# Patient Record
Sex: Female | Born: 1980 | Race: Black or African American | Hispanic: No | Marital: Single | Smoking: Current some day smoker
Health system: Southern US, Community
[De-identification: ages and names within clinical notes are randomized; demographics above are authoritative.]

## PROBLEM LIST (undated history)

## (undated) DIAGNOSIS — I10 Essential (primary) hypertension: Secondary | ICD-10-CM

## (undated) HISTORY — PX: DILATION AND CURETTAGE OF UTERUS: SHX78

---

## 2004-08-16 ENCOUNTER — Emergency Department: Payer: Self-pay | Admitting: Emergency Medicine

## 2005-03-29 ENCOUNTER — Other Ambulatory Visit: Payer: Self-pay

## 2005-03-29 ENCOUNTER — Emergency Department: Payer: Self-pay | Admitting: Emergency Medicine

## 2005-03-30 ENCOUNTER — Ambulatory Visit: Payer: Self-pay | Admitting: Emergency Medicine

## 2006-08-21 ENCOUNTER — Emergency Department: Payer: Self-pay | Admitting: Unknown Physician Specialty

## 2007-03-23 ENCOUNTER — Emergency Department: Payer: Self-pay | Admitting: Emergency Medicine

## 2007-06-25 ENCOUNTER — Emergency Department: Payer: Self-pay | Admitting: Emergency Medicine

## 2007-06-27 ENCOUNTER — Emergency Department: Payer: Self-pay | Admitting: Emergency Medicine

## 2007-06-29 ENCOUNTER — Emergency Department: Payer: Self-pay | Admitting: Emergency Medicine

## 2007-10-16 ENCOUNTER — Emergency Department: Payer: Self-pay | Admitting: Emergency Medicine

## 2008-03-24 ENCOUNTER — Emergency Department: Payer: Self-pay | Admitting: Emergency Medicine

## 2008-07-03 ENCOUNTER — Emergency Department: Payer: Self-pay | Admitting: Emergency Medicine

## 2009-02-13 ENCOUNTER — Emergency Department: Payer: Self-pay | Admitting: Emergency Medicine

## 2009-02-16 ENCOUNTER — Emergency Department: Payer: Self-pay | Admitting: Emergency Medicine

## 2009-04-23 ENCOUNTER — Emergency Department: Payer: Self-pay | Admitting: Internal Medicine

## 2009-08-29 ENCOUNTER — Emergency Department: Payer: Self-pay | Admitting: Emergency Medicine

## 2010-05-01 ENCOUNTER — Emergency Department: Payer: Self-pay | Admitting: Emergency Medicine

## 2010-11-29 ENCOUNTER — Ambulatory Visit: Payer: Self-pay | Admitting: Family Medicine

## 2011-01-06 ENCOUNTER — Emergency Department: Payer: Self-pay | Admitting: Emergency Medicine

## 2011-02-12 ENCOUNTER — Emergency Department: Payer: Self-pay | Admitting: Internal Medicine

## 2012-06-14 ENCOUNTER — Emergency Department: Payer: Self-pay | Admitting: Unknown Physician Specialty

## 2012-06-14 LAB — BASIC METABOLIC PANEL
Chloride: 107 mmol/L (ref 98–107)
EGFR (Non-African Amer.): >60
Glucose: 94 mg/dL (ref 65–99)
Osmolality: 280 (ref 275–301)
Sodium: 140 mmol/L (ref 136–145)

## 2012-06-14 LAB — CBC
HCT: 36.3 % (ref 35.0–47.0)
RBC: 5.04 10*6/uL (ref 3.80–5.20)

## 2012-06-14 LAB — TROPONIN I: Troponin-I: <0.02 ng/mL

## 2012-08-03 ENCOUNTER — Emergency Department: Payer: Self-pay | Admitting: Emergency Medicine

## 2012-08-22 ENCOUNTER — Emergency Department: Payer: Self-pay | Admitting: Emergency Medicine

## 2013-01-21 ENCOUNTER — Emergency Department (HOSPITAL_COMMUNITY): Payer: Medicaid Other

## 2013-01-21 ENCOUNTER — Encounter (HOSPITAL_COMMUNITY): Payer: Self-pay | Admitting: Emergency Medicine

## 2013-01-21 ENCOUNTER — Emergency Department (HOSPITAL_COMMUNITY)
Admission: EM | Admit: 2013-01-21 | Discharge: 2013-01-21 | Disposition: A | Discharge status: Home / Self Care | Payer: Medicaid Other | Attending: Emergency Medicine | Admitting: Emergency Medicine

## 2013-01-21 DIAGNOSIS — Y92410 Unspecified street and highway as the place of occurrence of the external cause: Secondary | ICD-10-CM | POA: Insufficient documentation

## 2013-01-21 DIAGNOSIS — Z79899 Other long term (current) drug therapy: Secondary | ICD-10-CM | POA: Insufficient documentation

## 2013-01-21 DIAGNOSIS — Z3202 Encounter for pregnancy test, result negative: Secondary | ICD-10-CM | POA: Insufficient documentation

## 2013-01-21 DIAGNOSIS — I10 Essential (primary) hypertension: Secondary | ICD-10-CM

## 2013-01-21 DIAGNOSIS — Y9389 Activity, other specified: Secondary | ICD-10-CM | POA: Insufficient documentation

## 2013-01-21 DIAGNOSIS — T07XXXA Unspecified multiple injuries, initial encounter: Secondary | ICD-10-CM

## 2013-01-21 DIAGNOSIS — Z791 Long term (current) use of non-steroidal anti-inflammatories (NSAID): Secondary | ICD-10-CM | POA: Insufficient documentation

## 2013-01-21 DIAGNOSIS — F17200 Nicotine dependence, unspecified, uncomplicated: Secondary | ICD-10-CM | POA: Insufficient documentation

## 2013-01-21 DIAGNOSIS — S301XXA Contusion of abdominal wall, initial encounter: Secondary | ICD-10-CM | POA: Insufficient documentation

## 2013-01-21 DIAGNOSIS — S40019A Contusion of unspecified shoulder, initial encounter: Secondary | ICD-10-CM | POA: Insufficient documentation

## 2013-01-21 HISTORY — DX: Essential (primary) hypertension: I10

## 2013-01-21 LAB — POCT I-STAT, CHEM 8
BUN: 13 mg/dL (ref 6–23)
Calcium, Ion: 1.2 mmol/L (ref 1.12–1.23)
Chloride: 103 mEq/L (ref 96–112)
Creatinine, Ser: 0.9 mg/dL (ref 0.50–1.10)
Glucose, Bld: 88 mg/dL (ref 70–99)
HCT: 40 % (ref 36.0–46.0)
Hemoglobin: 13.6 g/dL (ref 12.0–15.0)
Potassium: 3.5 mEq/L (ref 3.5–5.1)

## 2013-01-21 LAB — URINALYSIS, ROUTINE W REFLEX MICROSCOPIC
Bilirubin Urine: NEGATIVE
Hgb urine dipstick: NEGATIVE
Specific Gravity, Urine: 1.022 (ref 1.005–1.030)
Urobilinogen, UA: 0.2 mg/dL (ref 0.0–1.0)

## 2013-01-21 MED ORDER — IBUPROFEN 800 MG PO TABS: 800.0000 mg | ORAL_TABLET | Freq: Three times a day (TID) | ORAL | Status: DC

## 2013-01-21 MED ORDER — IOHEXOL 300 MG/ML  SOLN
100.0000 mL | Freq: Once | INTRAMUSCULAR | Status: AC | PRN
  Administered 2013-01-21: 100 mL via INTRAVENOUS
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
## 2013-01-26 ENCOUNTER — Emergency Department: Payer: Self-pay | Admitting: Emergency Medicine

## 2013-04-15 ENCOUNTER — Ambulatory Visit: Payer: Medicaid Other

## 2013-06-01 ENCOUNTER — Emergency Department: Payer: Self-pay | Admitting: Emergency Medicine

## 2013-06-01 LAB — URINALYSIS, COMPLETE
BACTERIA: NONE SEEN
Bilirubin,UR: NEGATIVE
Blood: NEGATIVE
Glucose,UR: NEGATIVE mg/dL (ref 0–75)
Ketone: NEGATIVE
Leukocyte Esterase: NEGATIVE
NITRITE: NEGATIVE
PROTEIN: NEGATIVE
Ph: 6 (ref 4.5–8.0)
Specific Gravity: 1.024 (ref 1.003–1.030)
Squamous Epithelial: 2
WBC UR: 2 /HPF (ref 0–5)

## 2013-06-01 LAB — CBC WITH DIFFERENTIAL/PLATELET
BASOS ABS: 0 10*3/uL (ref 0.0–0.1)
Basophil %: 0.4 %
EOS ABS: 0.2 10*3/uL (ref 0.0–0.7)
EOS PCT: 1.7 %
HCT: 37.7 % (ref 35.0–47.0)
HGB: 11.8 g/dL — ABNORMAL LOW (ref 12.0–16.0)
Lymphocyte #: 2.7 10*3/uL (ref 1.0–3.6)
Lymphocyte %: 21.5 %
MCH: 22 pg — AB (ref 26.0–34.0)
MCHC: 31.2 g/dL — ABNORMAL LOW (ref 32.0–36.0)
MCV: 71 fL — ABNORMAL LOW (ref 80–100)
MONO ABS: 0.7 x10 3/mm (ref 0.2–0.9)
Monocyte %: 5.7 %
Neutrophil #: 9 10*3/uL — ABNORMAL HIGH (ref 1.4–6.5)
Neutrophil %: 70.7 %
Platelet: 284 10*3/uL (ref 150–440)
RBC: 5.35 10*6/uL — ABNORMAL HIGH (ref 3.80–5.20)
RDW: 19.4 % — AB (ref 11.5–14.5)
WBC: 12.7 10*3/uL — ABNORMAL HIGH (ref 3.6–11.0)

## 2013-06-01 LAB — BASIC METABOLIC PANEL
Anion Gap: 4 — ABNORMAL LOW (ref 7–16)
BUN: 8 mg/dL (ref 7–18)
CALCIUM: 8.4 mg/dL — AB (ref 8.5–10.1)
CHLORIDE: 105 mmol/L (ref 98–107)
Co2: 26 mmol/L (ref 21–32)
Creatinine: 0.61 mg/dL (ref 0.60–1.30)
EGFR (Non-African Amer.): >60
Glucose: 101 mg/dL — ABNORMAL HIGH (ref 65–99)
Osmolality: 269 (ref 275–301)
Potassium: 3.2 mmol/L — ABNORMAL LOW (ref 3.5–5.1)
SODIUM: 135 mmol/L — AB (ref 136–145)

## 2013-06-01 LAB — HCG, QUANTITATIVE, PREGNANCY: Beta Hcg, Quant.: 64485 m[IU]/mL — ABNORMAL HIGH

## 2013-06-03 ENCOUNTER — Emergency Department: Payer: Self-pay | Admitting: Emergency Medicine

## 2013-06-03 LAB — BASIC METABOLIC PANEL
Anion Gap: 3 — ABNORMAL LOW (ref 7–16)
BUN: 7 mg/dL (ref 7–18)
Calcium, Total: 8 mg/dL — ABNORMAL LOW (ref 8.5–10.1)
Chloride: 106 mmol/L (ref 98–107)
Co2: 26 mmol/L (ref 21–32)
Creatinine: 0.68 mg/dL (ref 0.60–1.30)
EGFR (African American): >60
EGFR (Non-African Amer.): >60
Glucose: 94 mg/dL (ref 65–99)
Osmolality: 268 (ref 275–301)
Potassium: 3.5 mmol/L (ref 3.5–5.1)
Sodium: 135 mmol/L — ABNORMAL LOW (ref 136–145)

## 2013-06-03 LAB — CBC WITH DIFFERENTIAL/PLATELET
Basophil #: 0 10*3/uL (ref 0.0–0.1)
Basophil %: 0.3 %
Eosinophil #: 0.3 10*3/uL (ref 0.0–0.7)
Eosinophil %: 2.2 %
HCT: 35.2 % (ref 35.0–47.0)
HGB: 11.1 g/dL — ABNORMAL LOW (ref 12.0–16.0)
Lymphocyte #: 2.5 10*3/uL (ref 1.0–3.6)
Lymphocyte %: 21.4 %
MCH: 22.3 pg — ABNORMAL LOW (ref 26.0–34.0)
MCHC: 31.6 g/dL — ABNORMAL LOW (ref 32.0–36.0)
MCV: 71 fL — ABNORMAL LOW (ref 80–100)
Monocyte #: 0.7 x10 3/mm (ref 0.2–0.9)
Monocyte %: 6.1 %
Neutrophil #: 8.2 10*3/uL — ABNORMAL HIGH (ref 1.4–6.5)
Neutrophil %: 70 %
Platelet: 266 10*3/uL (ref 150–440)
RBC: 4.98 10*6/uL (ref 3.80–5.20)
RDW: 18.9 % — ABNORMAL HIGH (ref 11.5–14.5)
WBC: 11.8 10*3/uL — ABNORMAL HIGH (ref 3.6–11.0)

## 2013-06-15 ENCOUNTER — Encounter
Admit: 2013-06-15 | Disposition: A | Discharge status: Home / Self Care | Payer: Self-pay | Admitting: Maternal & Fetal Medicine

## 2013-06-16 LAB — CBC WITH DIFFERENTIAL/PLATELET
BASOS PCT: 0.3 %
Basophil #: 0 10*3/uL (ref 0.0–0.1)
EOS ABS: 0.2 10*3/uL (ref 0.0–0.7)
Eosinophil %: 2 %
HCT: 36.3 % (ref 35.0–47.0)
HGB: 11.2 g/dL — AB (ref 12.0–16.0)
LYMPHS PCT: 19.6 %
Lymphocyte #: 2.3 10*3/uL (ref 1.0–3.6)
MCH: 22.1 pg — AB (ref 26.0–34.0)
MCHC: 30.8 g/dL — AB (ref 32.0–36.0)
MCV: 72 fL — ABNORMAL LOW (ref 80–100)
Monocyte #: 0.8 x10 3/mm (ref 0.2–0.9)
Monocyte %: 6.7 %
NEUTROS PCT: 71.4 %
Neutrophil #: 8.5 10*3/uL — ABNORMAL HIGH (ref 1.4–6.5)
Platelet: 266 10*3/uL (ref 150–440)
RBC: 5.06 10*6/uL (ref 3.80–5.20)
RDW: 19.5 % — ABNORMAL HIGH (ref 11.5–14.5)
WBC: 11.9 10*3/uL — ABNORMAL HIGH (ref 3.6–11.0)

## 2013-06-16 LAB — BASIC METABOLIC PANEL
Anion Gap: 5 — ABNORMAL LOW (ref 7–16)
BUN: 6 mg/dL — ABNORMAL LOW (ref 7–18)
CREATININE: 0.74 mg/dL (ref 0.60–1.30)
Calcium, Total: 9 mg/dL (ref 8.5–10.1)
Chloride: 105 mmol/L (ref 98–107)
Co2: 26 mmol/L (ref 21–32)
EGFR (African American): >60
GLUCOSE: 96 mg/dL (ref 65–99)
Osmolality: 269 (ref 275–301)
Potassium: 4 mmol/L (ref 3.5–5.1)
SODIUM: 136 mmol/L (ref 136–145)

## 2013-06-16 LAB — URINALYSIS, COMPLETE
Bacteria: NONE SEEN
Bilirubin,UR: NEGATIVE
Blood: NEGATIVE
Glucose,UR: NEGATIVE mg/dL (ref 0–75)
Ketone: NEGATIVE
Leukocyte Esterase: NEGATIVE
NITRITE: NEGATIVE
PH: 7 (ref 4.5–8.0)
Protein: NEGATIVE
RBC, UR: NONE SEEN /HPF (ref 0–5)
Specific Gravity: 1.016 (ref 1.003–1.030)
Squamous Epithelial: 1
WBC UR: NONE SEEN /HPF (ref 0–5)

## 2013-06-16 LAB — MAGNESIUM: Magnesium: 1.3 mg/dL — ABNORMAL LOW

## 2013-06-17 ENCOUNTER — Observation Stay: Payer: Self-pay | Admitting: Obstetrics and Gynecology

## 2013-06-17 LAB — DRUG SCREEN, URINE
AMPHETAMINES, UR SCREEN: NEGATIVE (ref ?–1000)
BENZODIAZEPINE, UR SCRN: NEGATIVE (ref ?–200)
Barbiturates, Ur Screen: NEGATIVE (ref ?–200)
COCAINE METABOLITE, UR ~~LOC~~: NEGATIVE (ref ?–300)
Cannabinoid 50 Ng, Ur ~~LOC~~: NEGATIVE (ref ?–50)
MDMA (ECSTASY) UR SCREEN: POSITIVE (ref ?–500)
METHADONE, UR SCREEN: NEGATIVE (ref ?–300)
Opiate, Ur Screen: NEGATIVE (ref ?–300)
PHENCYCLIDINE (PCP) UR S: NEGATIVE (ref ?–25)
Tricyclic, Ur Screen: NEGATIVE (ref ?–1000)

## 2013-06-18 ENCOUNTER — Encounter: Payer: Self-pay | Admitting: Maternal & Fetal Medicine

## 2013-06-22 ENCOUNTER — Encounter: Payer: Self-pay | Admitting: Maternal and Fetal Medicine

## 2013-07-06 DIAGNOSIS — Z369 Encounter for antenatal screening, unspecified: Secondary | ICD-10-CM | POA: Insufficient documentation

## 2013-07-07 DIAGNOSIS — Z8759 Personal history of other complications of pregnancy, childbirth and the puerperium: Secondary | ICD-10-CM | POA: Insufficient documentation

## 2013-07-07 DIAGNOSIS — O99210 Obesity complicating pregnancy, unspecified trimester: Secondary | ICD-10-CM | POA: Insufficient documentation

## 2013-07-07 DIAGNOSIS — O34219 Maternal care for unspecified type scar from previous cesarean delivery: Secondary | ICD-10-CM | POA: Insufficient documentation

## 2013-07-07 DIAGNOSIS — F32A Depression, unspecified: Secondary | ICD-10-CM | POA: Insufficient documentation

## 2013-07-07 DIAGNOSIS — O10919 Unspecified pre-existing hypertension complicating pregnancy, unspecified trimester: Secondary | ICD-10-CM | POA: Insufficient documentation

## 2013-07-07 DIAGNOSIS — F329 Major depressive disorder, single episode, unspecified: Secondary | ICD-10-CM | POA: Insufficient documentation

## 2013-08-27 ENCOUNTER — Encounter: Payer: Self-pay | Admitting: Obstetrics and Gynecology

## 2013-10-01 ENCOUNTER — Encounter: Payer: Self-pay | Admitting: Maternal & Fetal Medicine

## 2013-10-06 ENCOUNTER — Other Ambulatory Visit: Payer: Self-pay | Admitting: Maternal & Fetal Medicine

## 2013-10-06 LAB — GLUCOSE, 3 HOUR
GLUCOSE 3 HOUR: 93 mg/dL
GLUCOSE FASTING: 81 mg/dL (ref 70–110)
Glucose 1 Hour: 149 mg/dL
Glucose 2 Hour: 145 mg/dL

## 2013-10-16 ENCOUNTER — Encounter (HOSPITAL_COMMUNITY): Payer: Self-pay

## 2013-10-16 ENCOUNTER — Inpatient Hospital Stay (HOSPITAL_COMMUNITY)
Admission: AD | Admit: 2013-10-16 | Discharge: 2013-10-20 | DRG: 765 | Disposition: A | Discharge status: Home / Self Care | Payer: Medicaid Other | Source: Ambulatory Visit | Attending: Family Medicine | Admitting: Family Medicine

## 2013-10-16 ENCOUNTER — Inpatient Hospital Stay (HOSPITAL_COMMUNITY): Payer: Medicaid Other

## 2013-10-16 DIAGNOSIS — O99344 Other mental disorders complicating childbirth: Secondary | ICD-10-CM | POA: Diagnosis present

## 2013-10-16 DIAGNOSIS — IMO0002 Reserved for concepts with insufficient information to code with codable children: Secondary | ICD-10-CM | POA: Diagnosis present

## 2013-10-16 DIAGNOSIS — I10 Essential (primary) hypertension: Secondary | ICD-10-CM | POA: Diagnosis present

## 2013-10-16 DIAGNOSIS — O99330 Smoking (tobacco) complicating pregnancy, unspecified trimester: Secondary | ICD-10-CM

## 2013-10-16 DIAGNOSIS — F1911 Other psychoactive substance abuse, in remission: Secondary | ICD-10-CM

## 2013-10-16 DIAGNOSIS — O99214 Obesity complicating childbirth: Secondary | ICD-10-CM

## 2013-10-16 DIAGNOSIS — F329 Major depressive disorder, single episode, unspecified: Secondary | ICD-10-CM | POA: Diagnosis present

## 2013-10-16 DIAGNOSIS — O34219 Maternal care for unspecified type scar from previous cesarean delivery: Secondary | ICD-10-CM | POA: Diagnosis present

## 2013-10-16 DIAGNOSIS — F1210 Cannabis abuse, uncomplicated: Secondary | ICD-10-CM | POA: Diagnosis present

## 2013-10-16 DIAGNOSIS — Z6841 Body Mass Index (BMI) 40.0 and over, adult: Secondary | ICD-10-CM

## 2013-10-16 DIAGNOSIS — Z302 Encounter for sterilization: Secondary | ICD-10-CM | POA: Diagnosis not present

## 2013-10-16 DIAGNOSIS — E669 Obesity, unspecified: Secondary | ICD-10-CM | POA: Diagnosis present

## 2013-10-16 DIAGNOSIS — O99210 Obesity complicating pregnancy, unspecified trimester: Secondary | ICD-10-CM

## 2013-10-16 DIAGNOSIS — F3289 Other specified depressive episodes: Secondary | ICD-10-CM | POA: Diagnosis present

## 2013-10-16 DIAGNOSIS — O10913 Unspecified pre-existing hypertension complicating pregnancy, third trimester: Secondary | ICD-10-CM

## 2013-10-16 DIAGNOSIS — O99334 Smoking (tobacco) complicating childbirth: Secondary | ICD-10-CM | POA: Diagnosis present

## 2013-10-16 DIAGNOSIS — O10019 Pre-existing essential hypertension complicating pregnancy, unspecified trimester: Secondary | ICD-10-CM | POA: Diagnosis present

## 2013-10-16 DIAGNOSIS — O10919 Unspecified pre-existing hypertension complicating pregnancy, unspecified trimester: Secondary | ICD-10-CM | POA: Diagnosis present

## 2013-10-16 LAB — PROTEIN / CREATININE RATIO, URINE
CREATININE, URINE: 246.51 mg/dL
Protein Creatinine Ratio: 0.1 (ref 0.00–0.15)
Total Protein, Urine: 24.4 mg/dL

## 2013-10-16 LAB — CBC
HCT: 34.9 % — ABNORMAL LOW (ref 36.0–46.0)
Hemoglobin: 11.4 g/dL — ABNORMAL LOW (ref 12.0–15.0)
MCH: 24.2 pg — AB (ref 26.0–34.0)
MCHC: 32.7 g/dL (ref 30.0–36.0)
MCV: 73.9 fL — ABNORMAL LOW (ref 78.0–100.0)
PLATELETS: 212 10*3/uL (ref 150–400)
RBC: 4.72 MIL/uL (ref 3.87–5.11)
RDW: 15.5 % (ref 11.5–15.5)
WBC: 10.3 10*3/uL (ref 4.0–10.5)

## 2013-10-16 LAB — URINALYSIS, ROUTINE W REFLEX MICROSCOPIC
Bilirubin Urine: NEGATIVE
Glucose, UA: NEGATIVE mg/dL
Hgb urine dipstick: NEGATIVE
KETONES UR: NEGATIVE mg/dL
LEUKOCYTES UA: NEGATIVE
NITRITE: NEGATIVE
PROTEIN: NEGATIVE mg/dL
Specific Gravity, Urine: 1.02 (ref 1.005–1.030)
Urobilinogen, UA: 1 mg/dL (ref 0.0–1.0)
pH: 6.5 (ref 5.0–8.0)

## 2013-10-16 LAB — COMPREHENSIVE METABOLIC PANEL
ALT: 14 U/L (ref 0–35)
ANION GAP: 12 (ref 5–15)
AST: 11 U/L (ref 0–37)
Albumin: 2.8 g/dL — ABNORMAL LOW (ref 3.5–5.2)
Alkaline Phosphatase: 82 U/L (ref 39–117)
BUN: 7 mg/dL (ref 6–23)
CALCIUM: 9.3 mg/dL (ref 8.4–10.5)
CO2: 25 mEq/L (ref 19–32)
CREATININE: 0.63 mg/dL (ref 0.50–1.10)
Chloride: 98 mEq/L (ref 96–112)
GFR calc non Af Amer: >90 mL/min (ref >90)
GLUCOSE: 91 mg/dL (ref 70–99)
Potassium: 4.2 mEq/L (ref 3.7–5.3)
Sodium: 135 mEq/L — ABNORMAL LOW (ref 137–147)
Total Bilirubin: <0.2 mg/dL — ABNORMAL LOW (ref 0.3–1.2)
Total Protein: 6.6 g/dL (ref 6.0–8.3)

## 2013-10-16 MED ORDER — LABETALOL HCL 300 MG PO TABS: 800.0000 mg | ORAL_TABLET | Freq: Three times a day (TID) | ORAL | Status: DC

## 2013-10-16 MED ORDER — GI COCKTAIL ~~LOC~~
30.0000 mL | Freq: Once | ORAL | Status: AC
  Administered 2013-10-16: 30 mL via ORAL
  Filled 2013-10-16: qty 30

## 2013-10-16 MED ORDER — NIFEDIPINE ER OSMOTIC RELEASE 30 MG PO TB24: 60.0000 mg | ORAL_TABLET | Freq: Every day | ORAL | Status: DC

## 2013-10-16 MED ORDER — CALCIUM CARBONATE ANTACID 500 MG PO CHEW
2.0000 | CHEWABLE_TABLET | ORAL | Status: DC | PRN
  Filled 2013-10-16: qty 2

## 2013-10-16 MED ORDER — MAGNESIUM SULFATE BOLUS VIA INFUSION
4.0000 g | Freq: Once | INTRAVENOUS | Status: AC
  Administered 2013-10-16: 4 g via INTRAVENOUS
  Filled 2013-10-16: qty 500

## 2013-10-16 MED ORDER — LABETALOL HCL 200 MG PO TABS
800.0000 mg | ORAL_TABLET | Freq: Three times a day (TID) | ORAL | Status: DC
  Administered 2013-10-16 – 2013-10-20 (×10): 800 mg via ORAL
  Filled 2013-10-16 (×14): qty 4
  Filled 2013-10-16: qty 8
  Filled 2013-10-16: qty 4

## 2013-10-16 MED ORDER — FAMOTIDINE 20 MG PO TABS
20.0000 mg | ORAL_TABLET | Freq: Two times a day (BID) | ORAL | Status: DC
  Administered 2013-10-16 – 2013-10-17 (×2): 20 mg via ORAL
  Filled 2013-10-16 (×3): qty 1

## 2013-10-16 MED ORDER — ASPIRIN 81 MG PO CHEW
81.0000 mg | CHEWABLE_TABLET | Freq: Every day | ORAL | Status: DC
  Administered 2013-10-17: 81 mg via ORAL
  Filled 2013-10-16 (×2): qty 1

## 2013-10-16 MED ORDER — PRENATAL MULTIVITAMIN CH
1.0000 | ORAL_TABLET | Freq: Every day | ORAL | Status: DC
  Administered 2013-10-17: 1 via ORAL
  Filled 2013-10-16: qty 1

## 2013-10-16 MED ORDER — NIFEDIPINE ER OSMOTIC RELEASE 30 MG PO TB24
60.0000 mg | ORAL_TABLET | Freq: Every morning | ORAL | Status: DC
  Administered 2013-10-17: 60 mg via ORAL
  Filled 2013-10-16: qty 2

## 2013-10-16 MED ORDER — MAGNESIUM SULFATE 40 G IN LACTATED RINGERS - SIMPLE
2.0000 g/h | INTRAVENOUS | Status: AC
  Administered 2013-10-17: 2 g/h via INTRAVENOUS
  Filled 2013-10-16 (×4): qty 500

## 2013-10-16 MED ORDER — RANITIDINE HCL 150 MG/10ML PO SYRP: 150.0000 mg | ORAL_SOLUTION | Freq: Two times a day (BID) | ORAL | Status: DC

## 2013-10-16 MED ORDER — LACTATED RINGERS IV SOLN
INTRAVENOUS | Status: DC
  Administered 2013-10-16 – 2013-10-17 (×3): via INTRAVENOUS

## 2013-10-16 MED ORDER — ZOLPIDEM TARTRATE 5 MG PO TABS
5.0000 mg | ORAL_TABLET | Freq: Every evening | ORAL | Status: DC | PRN
  Administered 2013-10-17: 5 mg via ORAL
  Filled 2013-10-16: qty 1

## 2013-10-16 MED ORDER — DOCUSATE SODIUM 100 MG PO CAPS
100.0000 mg | ORAL_CAPSULE | Freq: Every day | ORAL | Status: DC
  Administered 2013-10-17: 100 mg via ORAL
  Filled 2013-10-16 (×3): qty 1

## 2013-10-16 MED ORDER — LABETALOL HCL 5 MG/ML IV SOLN
20.0000 mg | INTRAVENOUS | Status: DC | PRN
  Administered 2013-10-16 – 2013-10-17 (×9): 20 mg via INTRAVENOUS
  Filled 2013-10-16 (×9): qty 4

## 2013-10-16 MED ORDER — HYDRALAZINE HCL 20 MG/ML IJ SOLN
10.0000 mg | INTRAMUSCULAR | Status: DC | PRN
  Administered 2013-10-16 – 2013-10-17 (×4): 10 mg via INTRAVENOUS
  Filled 2013-10-16 (×5): qty 1

## 2013-10-16 MED ORDER — ACETAMINOPHEN 325 MG PO TABS
650.0000 mg | ORAL_TABLET | ORAL | Status: DC | PRN
  Administered 2013-10-17 (×2): 650 mg via ORAL
  Filled 2013-10-16 (×2): qty 2

## 2013-10-16 NOTE — H&P (Signed)
FACULTY PRACTICE ANTEPARTUM ADMISSION HISTORY AND PHYSICAL NOTE   History of Present Illness: Ana May is a 33 y.o. [phone] with hx of pLTCS 2/2 preE at 36w at [redacted]w[redacted]d admitted for gestational hypertension, eval for preeclampsia.  Gets prenatal care at [facility], admitted for 2 days last week, currently on 800mg  labetalol TID and nifedipine 60mg  daily (last dose of labetalol at 1500, did take nifedipine this morning).  Reports for past 2 days blood pressure has been uncontrolled, up to 184/124, 163/118, reporting diastolic pressure consistently high.  After discharge reports blood pressure was 160s/80s.  Also complaining of midepigastric discomfort, believes it is heart burn and would like something for it; usually takes tagamet but did not take any today. - +HA earlier this morning has since resolved, no RUQ pain, +lower extremity edema baseline, denies visual changes.  Patient reports the fetal movement as active. Patient reports uterine contraction  activity as none. Patient reports  vaginal bleeding as none. Patient describes fluid per vagina as None. Fetal presentation is transverse.  Admitted to [facility] 7/22 for hypertensive emergency with BP 220/120, preE work up negative and pt discharged with nifedipine XL 60mg  daily and labetalol 800mg  TID.  She was seen in clinic yesterday, noted to have a headache and BP of 180/108, reported she had to pick up her daughter and needed to go to  -[facility]ERN[facility]21 normal, however abnormal first trimester screening results from first trimester screening at [facility] with 1 in 140 Down syndrome risk.  Patient Active Problem List   Diagnosis Date Noted  . Chronic hypertension with exacerbation during pregnancy 10/16/2013  . Previous cesarean delivery, antepartum condition or complication 07/07/2013  . Obesity affecting pregnancy, antepartum 07/07/2013  . H/O previous obstetrical problem 07/07/2013  . Chronic hypertension in pregnancy 07/07/2013  .  Clinical depression 07/07/2013  . Encounter for antenatal screening of mother 07/06/2013     Past Medical History  Diagnosis Date  . Hypertension      Past Surgical History  Procedure Laterality Date  . Cesarean section    . Dilation and curettage of uterus       OB History   Grav Para Term Preterm Abortions TAB SAB Ect Mult Living   3 1  1 1  1   1      2003 36w induced vaginal delivery for preE 2013 TAB 2015 current  History   Social History  . Marital Status: Single    Spouse Name: N/A    Number of Children: N/A  . Years of Education: N/A   Social History Main Topics  . Smoking status: Former Smoker    Quit date: 02/16/2012  . Smokeless tobacco: None  . Alcohol Use: Yes     Comment: none with pregnancy,but occasionally before pregnancy  . Drug Use: No  . Sexual Activity: Yes   Other Topics Concern  . None   Social History Narrative  . None    History reviewed. No pertinent family history.  No Known Allergies  Prescriptions prior to admission  Medication Sig Dispense Refill  . aspirin 81 MG tablet Take 81 mg by mouth daily.      . clindamycin-benzoyl peroxide (BENZACLIN) gel Apply topically.      Marland Kitchen labetalol (NORMODYNE) 200 MG tablet Take 800 mg by mouth 3 (three) times daily.      Marland Kitchen NIFEdipine (PROCARDIA XL/ADALAT-CC) 60 MG 24 hr tablet Take 60 mg by mouth daily.      . Prenatal Vit-Fe Fumarate-FA (PRENATAL MULTIVITAMIN) TABS tablet  Take 1 tablet by mouth daily at 12 noon.      . valACYclovir (VALTREX) 500 MG tablet Take by mouth.      - has not taken ASA in a few weeks  . docusate sodium  100 mg Oral Daily  . famotidine  20 mg Oral BID   I have reviewed the patient's current medications.  Review of Systems - General ROS: negative Psychological ROS: negative Ophthalmic ROS: negative ENT ROS: negative Hematological and Lymphatic ROS: negative Respiratory ROS: no cough, shortness of breath, or wheezing Cardiovascular ROS: no chest pain or  dyspnea on exertion +lower extr edema Gastrointestinal ROS: positive for - midepigastric pain, diarrhea, heartburn negative for - constipation or nausea/vomiting Musculoskeletal ROS: negative Neurological ROS: no current headache, +headache yesterday  Vitals:  BP 171/114  Pulse 83  Temp(Src) 98.6 F (37 C) (Oral)  Resp 20  Ht 5' 5.5" (1.664 m)  Wt 123.741 kg (272 lb 12.8 oz)  BMI 44.69 kg/m2  SpO2 98% Physical Examination:  General appearance - alert, well appearing, and in no distress Eyes - pupils equal and reactive, extraocular eye movements intact Heart - normal rate,  Abdomen - soft, nontender, nondistended, no masses or organomegaly Neurological - alert, oriented, normal speech, no focal findings or movement disorder noted Musculoskeletal - no joint tenderness, deformity or swelling Extremities - peripheral pulses normal, no pedal edema, no clubbing or cyanosis Skin - normal coloration and turgor, no rashes, no suspicious skin lesions noted Abdomen: gravid Pelvic Exam:deferred Cervix: deferred Extremities: edema trace in feet, symmetrical 3+ in leg and Homans sign is negative, no sign of DVT Membranes:intact Fetal Monitoring:Baseline: 125 bpm, Variability: minimal, Accelerations: nonreactive and Decelerations: Absent TOCO: none  Labs:  Results for orders placed during the hospital encounter of 10/16/13 (from the past 24 hour(s))  URINALYSIS, ROUTINE W REFLEX MICROSCOPIC   Collection Time    10/16/13  4:00 PM      Result Value Ref Range   Color, Urine YELLOW  YELLOW   APPearance CLEAR  CLEAR   Specific Gravity, Urine 1.020  1.005 - 1.030   pH 6.5  5.0 - 8.0   Glucose, UA NEGATIVE  NEGATIVE mg/dL   Hgb urine dipstick NEGATIVE  NEGATIVE   Bilirubin Urine NEGATIVE  NEGATIVE   Ketones, ur NEGATIVE  NEGATIVE mg/dL   Protein, ur NEGATIVE  NEGATIVE mg/dL   Urobilinogen, UA 1.0  0.0 - 1.0 mg/dL   Nitrite NEGATIVE  NEGATIVE   Leukocytes, UA NEGATIVE  NEGATIVE  PROTEIN /  CREATININE RATIO, URINE   Collection Time    10/16/13  4:00 PM      Result Value Ref Range   Creatinine, Urine 246.51     Total Protein, Urine 24.4     PROTEIN CREATININE RATIO 0.10  0.00 - 0.15  COMPREHENSIVE METABOLIC PANEL   Collection Time    10/16/13  4:22 PM      Result Value Ref Range   Sodium 135 (*) 137 - 147 mEq/L   Potassium 4.2  3.7 - 5.3 mEq/L   Chloride 98  96 - 112 mEq/L   CO2 25  19 - 32 mEq/L   Glucose, Bld 91  70 - 99 mg/dL   BUN 7  6 - 23 mg/dL   Creatinine, Ser 1.61  0.50 - 1.10 mg/dL   Calcium 9.3  8.4 - 09.6 mg/dL   Total Protein 6.6  6.0 - 8.3 g/dL   Albumin 2.8 (*) 3.5 - 5.2 g/dL  AST 11  0 - 37 U/L   ALT 14  0 - 35 U/L   Alkaline Phosphatase 82  39 - 117 U/L   Total Bilirubin <0.2 (*) 0.3 - 1.2 mg/dL   GFR calc non Af Amer >90  >90 mL/min   GFR calc Af Amer >90  >90 mL/min   Anion gap 12  5 - 15  CBC   Collection Time    10/16/13  4:22 PM      Result Value Ref Range   WBC 10.3  4.0 - 10.5 K/uL   RBC 4.72  3.87 - 5.11 MIL/uL   Hemoglobin 11.4 (*) 12.0 - 15.0 g/dL   HCT 09.834.9 (*) 11.936.0 - 14.746.0 %   MCV 73.9 (*) 78.0 - 100.0 fL   MCH 24.2 (*) 26.0 - 34.0 pg   MCHC 32.7  30.0 - 36.0 g/dL   RDW 82.915.5  56.211.5 - 13.015.5 %   Platelets 212  150 - 400 K/uL    Imaging Studies: No results found.  Active Problems:   Chronic hypertension with exacerbation during pregnancy - obesity - hx of THC usage - current smoker (per [facility] records)  Assessment and Plan: Pre-e r/o: 24h urine protein and creatinine clearance, CMP wnl, platelets wnl, urine pr/cr ratio normal  - no mag indicated at this time, no severe symptoms CHTN: restart home nifedipine 60mg , labetalol 800mg  TID; hydralazine and labetalol IV PRN for now  - if to be delivered pt requests TOL, discussed risks, pt currently transverse presentation.  Do not anticipate eminent delivery, as such will hold on mag for neuro protection NST: minimal variability, has few 10x10 accels, is reactive however will order  BPP Fetal monitoring: continuous, will request records for growth sonos from [facility], if needed will order dopplers.  Perry MountACOSTA,Silveria Botz ROCIO, MD 7:43 PM

## 2013-10-16 NOTE — MAU Note (Signed)
Patient states she had HTN and is on medication, Labetalol 800mg  tid and Nifedipine 60 mg daily. [facility] States she monitors her blood pressure at home and has been high for the past few days, today 184/124. Denies bleeding, leaking or contractions. Reports good fetal movement. Denies headaches or visual changes but does have epigastric pain and swelling in both feet and legs. Reports good fetal movement.

## 2013-10-16 NOTE — H&P (Signed)
Attestation of Attending Supervision of Advanced Practitioner (PA/CNM/NP): Evaluation and management procedures were performed by the Advanced Practitioner under my supervision and collaboration.  I have reviewed the Advanced Practitioner's note and chart, and I agree with the management and plan.  Reva BoresPRATT,TANYA S, MD [facility] Attending 10/16/2013 9:15 PM

## 2013-10-17 ENCOUNTER — Encounter (HOSPITAL_COMMUNITY): Payer: Self-pay | Admitting: *Deleted

## 2013-10-17 ENCOUNTER — Encounter (HOSPITAL_COMMUNITY)
Admission: AD | Disposition: A | Discharge status: Home / Self Care | Payer: Self-pay | Source: Ambulatory Visit | Attending: Family Medicine

## 2013-10-17 ENCOUNTER — Encounter (HOSPITAL_COMMUNITY): Payer: Medicaid Other | Admitting: Anesthesiology

## 2013-10-17 ENCOUNTER — Inpatient Hospital Stay (HOSPITAL_COMMUNITY): Payer: Medicaid Other | Admitting: Anesthesiology

## 2013-10-17 DIAGNOSIS — I10 Essential (primary) hypertension: Secondary | ICD-10-CM

## 2013-10-17 DIAGNOSIS — Z6841 Body Mass Index (BMI) 40.0 and over, adult: Secondary | ICD-10-CM

## 2013-10-17 DIAGNOSIS — IMO0002 Reserved for concepts with insufficient information to code with codable children: Secondary | ICD-10-CM

## 2013-10-17 DIAGNOSIS — O10019 Pre-existing essential hypertension complicating pregnancy, unspecified trimester: Secondary | ICD-10-CM

## 2013-10-17 DIAGNOSIS — O34219 Maternal care for unspecified type scar from previous cesarean delivery: Secondary | ICD-10-CM

## 2013-10-17 LAB — CBC
HEMATOCRIT: 36.2 % (ref 36.0–46.0)
HEMATOCRIT: 36 % (ref 36.0–46.0)
HEMOGLOBIN: 12 g/dL (ref 12.0–15.0)
Hemoglobin: 11.9 g/dL — ABNORMAL LOW (ref 12.0–15.0)
MCH: 24 pg — ABNORMAL LOW (ref 26.0–34.0)
MCH: 24.3 pg — AB (ref 26.0–34.0)
MCHC: 32.9 g/dL (ref 30.0–36.0)
MCHC: 33.3 g/dL (ref 30.0–36.0)
MCV: 73 fL — ABNORMAL LOW (ref 78.0–100.0)
MCV: 73 fL — AB (ref 78.0–100.0)
Platelets: 216 10*3/uL (ref 150–400)
Platelets: 222 10*3/uL (ref 150–400)
RBC: 4.96 MIL/uL (ref 3.87–5.11)
RBC: 4.93 MIL/uL (ref 3.87–5.11)
RDW: 15.7 % — AB (ref 11.5–15.5)
RDW: 15.6 % — AB (ref 11.5–15.5)
WBC: 10.7 10*3/uL — AB (ref 4.0–10.5)
WBC: 11.7 10*3/uL — ABNORMAL HIGH (ref 4.0–10.5)

## 2013-10-17 LAB — RAPID URINE DRUG SCREEN, HOSP PERFORMED
Amphetamines: POSITIVE — AB
BARBITURATES: POSITIVE — AB
BENZODIAZEPINES: NOT DETECTED
Cocaine: NOT DETECTED
Opiates: NOT DETECTED
TETRAHYDROCANNABINOL: NOT DETECTED

## 2013-10-17 LAB — CREATININE CLEARANCE, URINE, 24 HOUR
COLLECTION INTERVAL-CRCL: 24 h
CREATININE 24H UR: 1710 mg/d (ref 700–1800)
CREATININE: 0.57 mg/dL (ref 0.50–1.10)
Creatinine Clearance: 208 mL/min — ABNORMAL HIGH (ref 75–115)
Creatinine, Urine: 51.03 mg/dL
URINE TOTAL VOLUME-CRCL: 3350 mL

## 2013-10-17 LAB — PREPARE RBC (CROSSMATCH)

## 2013-10-17 LAB — COMPREHENSIVE METABOLIC PANEL
ALBUMIN: 2.9 g/dL — AB (ref 3.5–5.2)
ALK PHOS: 88 U/L (ref 39–117)
ALT: 13 U/L (ref 0–35)
AST: 11 U/L (ref 0–37)
Anion gap: 13 (ref 5–15)
BUN: 6 mg/dL (ref 6–23)
CHLORIDE: 102 meq/L (ref 96–112)
CO2: 22 mEq/L (ref 19–32)
Calcium: 8.6 mg/dL (ref 8.4–10.5)
Creatinine, Ser: 0.57 mg/dL (ref 0.50–1.10)
GFR calc Af Amer: >90 mL/min (ref >90)
GFR calc non Af Amer: >90 mL/min (ref >90)
Glucose, Bld: 95 mg/dL (ref 70–99)
POTASSIUM: 3.9 meq/L (ref 3.7–5.3)
SODIUM: 137 meq/L (ref 137–147)
TOTAL PROTEIN: 7 g/dL (ref 6.0–8.3)

## 2013-10-17 SURGERY — Surgical Case
Anesthesia: Spinal | Site: Abdomen

## 2013-10-17 MED ORDER — PHENYLEPHRINE 8 MG IN D5W 100 ML (0.08MG/ML) PREMIX OPTIME
INJECTION | INTRAVENOUS | Status: AC
  Filled 2013-10-17: qty 100

## 2013-10-17 MED ORDER — CITRIC ACID-SODIUM CITRATE 334-500 MG/5ML PO SOLN
ORAL | Status: AC
  Administered 2013-10-17: 30 mL
  Filled 2013-10-17: qty 15

## 2013-10-17 MED ORDER — FENTANYL CITRATE 0.05 MG/ML IJ SOLN
INTRAMUSCULAR | Status: AC
Start: 2013-10-17 — End: 2013-10-17
  Filled 2013-10-17: qty 2

## 2013-10-17 MED ORDER — KETOROLAC TROMETHAMINE 30 MG/ML IJ SOLN
30.0000 mg | Freq: Four times a day (QID) | INTRAMUSCULAR | Status: DC | PRN
  Administered 2013-10-18: 30 mg via INTRAVENOUS

## 2013-10-17 MED ORDER — DIPHENHYDRAMINE HCL 50 MG/ML IJ SOLN
INTRAMUSCULAR | Status: AC
  Filled 2013-10-17: qty 1

## 2013-10-17 MED ORDER — DIPHENHYDRAMINE HCL 25 MG PO CAPS
25.0000 mg | ORAL_CAPSULE | ORAL | Status: DC | PRN
  Filled 2013-10-17: qty 1

## 2013-10-17 MED ORDER — MORPHINE SULFATE 0.5 MG/ML IJ SOLN
INTRAMUSCULAR | Status: AC
  Filled 2013-10-17: qty 10

## 2013-10-17 MED ORDER — LACTATED RINGERS IV SOLN
40.0000 [IU] | INTRAVENOUS | Status: DC | PRN
  Administered 2013-10-17: 40 [IU] via INTRAVENOUS

## 2013-10-17 MED ORDER — DEXTROSE 5 % IV SOLN
INTRAVENOUS | Status: AC
  Filled 2013-10-17: qty 3000

## 2013-10-17 MED ORDER — DIPHENHYDRAMINE HCL 50 MG/ML IJ SOLN: 25.0000 mg | INTRAMUSCULAR | Status: DC | PRN

## 2013-10-17 MED ORDER — ONDANSETRON HCL 4 MG/2ML IJ SOLN
INTRAMUSCULAR | Status: DC | PRN
  Administered 2013-10-17: 4 mg via INTRAVENOUS

## 2013-10-17 MED ORDER — OXYTOCIN 10 UNIT/ML IJ SOLN
INTRAMUSCULAR | Status: AC
  Filled 2013-10-17: qty 4

## 2013-10-17 MED ORDER — LACTATED RINGERS IV SOLN
INTRAVENOUS | Status: DC | PRN
  Administered 2013-10-17 (×2): via INTRAVENOUS

## 2013-10-17 MED ORDER — PHENYLEPHRINE 8 MG IN D5W 100 ML (0.08MG/ML) PREMIX OPTIME
INJECTION | INTRAVENOUS | Status: DC | PRN
  Administered 2013-10-17: 60 ug/min via INTRAVENOUS

## 2013-10-17 MED ORDER — FENTANYL CITRATE 0.05 MG/ML IJ SOLN
25.0000 ug | INTRAMUSCULAR | Status: DC | PRN
  Administered 2013-10-18 (×4): 50 ug via INTRAVENOUS

## 2013-10-17 MED ORDER — NIFEDIPINE ER 60 MG PO TB24
120.0000 mg | ORAL_TABLET | Freq: Two times a day (BID) | ORAL | Status: DC
  Administered 2013-10-17 – 2013-10-18 (×2): 120 mg via ORAL
  Filled 2013-10-17 (×5): qty 2

## 2013-10-17 MED ORDER — KETOROLAC TROMETHAMINE 30 MG/ML IJ SOLN
INTRAMUSCULAR | Status: AC
  Filled 2013-10-17: qty 1

## 2013-10-17 MED ORDER — MEPERIDINE HCL 25 MG/ML IJ SOLN: 6.2500 mg | INTRAMUSCULAR | Status: DC | PRN

## 2013-10-17 MED ORDER — SCOPOLAMINE 1 MG/3DAYS TD PT72
MEDICATED_PATCH | TRANSDERMAL | Status: AC
  Filled 2013-10-17: qty 1

## 2013-10-17 MED ORDER — KETOROLAC TROMETHAMINE 30 MG/ML IJ SOLN: 30.0000 mg | Freq: Four times a day (QID) | INTRAMUSCULAR | Status: DC | PRN

## 2013-10-17 MED ORDER — MORPHINE SULFATE (PF) 0.5 MG/ML IJ SOLN
INTRAMUSCULAR | Status: DC | PRN
  Administered 2013-10-17: .2 mg via INTRATHECAL

## 2013-10-17 MED ORDER — CEFAZOLIN SODIUM 10 G IJ SOLR
3.0000 g | INTRAMUSCULAR | Status: DC | PRN
  Administered 2013-10-17: 3 g via INTRAVENOUS

## 2013-10-17 MED ORDER — BUPIVACAINE IN DEXTROSE 0.75-8.25 % IT SOLN
INTRATHECAL | Status: DC | PRN
  Administered 2013-10-17: 12 mg via INTRATHECAL

## 2013-10-17 MED ORDER — SCOPOLAMINE 1 MG/3DAYS TD PT72
1.0000 | MEDICATED_PATCH | Freq: Once | TRANSDERMAL | Status: DC
  Administered 2013-10-18: 1.5 mg via TRANSDERMAL

## 2013-10-17 MED ORDER — DIPHENHYDRAMINE HCL 50 MG/ML IJ SOLN
12.5000 mg | INTRAMUSCULAR | Status: DC | PRN
  Administered 2013-10-18: 12.5 mg via INTRAVENOUS

## 2013-10-17 MED ORDER — NIFEDIPINE ER OSMOTIC RELEASE 30 MG PO TB24: 120.0000 mg | ORAL_TABLET | Freq: Two times a day (BID) | ORAL | Status: DC

## 2013-10-17 MED ORDER — SODIUM CHLORIDE 0.9 % IV SOLN: Freq: Once | INTRAVENOUS | Status: DC

## 2013-10-17 MED ORDER — ONDANSETRON HCL 4 MG/2ML IJ SOLN
INTRAMUSCULAR | Status: AC
  Filled 2013-10-17: qty 2

## 2013-10-17 MED ORDER — FENTANYL CITRATE 0.05 MG/ML IJ SOLN
INTRAMUSCULAR | Status: DC | PRN
  Administered 2013-10-17: 25 ug via INTRAVENOUS
  Administered 2013-10-17: 25 ug via INTRATHECAL
  Administered 2013-10-17: 25 ug via INTRAVENOUS

## 2013-10-17 SURGICAL SUPPLY — 37 items
BENZOIN TINCTURE PRP APPL 2/3 (GAUZE/BANDAGES/DRESSINGS) ×4 IMPLANT
BLADE SURG 10 STRL SS (BLADE) ×4 IMPLANT
CLAMP CORD UMBIL (MISCELLANEOUS) IMPLANT
CLIP FILSHIE TUBAL LIGA STRL (Clip) ×2 IMPLANT
CLOTH BEACON ORANGE TIMEOUT ST (SAFETY) ×2 IMPLANT
DRAPE LG THREE QUARTER DISP (DRAPES) IMPLANT
DRSG OPSITE POSTOP 4X10 (GAUZE/BANDAGES/DRESSINGS) ×2 IMPLANT
DURAPREP 26ML APPLICATOR (WOUND CARE) ×2 IMPLANT
ELECT REM PT RETURN 9FT ADLT (ELECTROSURGICAL) ×2
ELECTRODE REM PT RTRN 9FT ADLT (ELECTROSURGICAL) ×1 IMPLANT
EXTRACTOR VACUUM KIWI (MISCELLANEOUS) IMPLANT
GLOVE BIO SURGEON ST LM GN SZ9 (GLOVE) ×2 IMPLANT
GLOVE BIOGEL PI IND STRL 9 (GLOVE) ×1 IMPLANT
GLOVE BIOGEL PI INDICATOR 9 (GLOVE) ×1
GOWN STRL REUS W/TWL 2XL LVL3 (GOWN DISPOSABLE) ×2 IMPLANT
GOWN STRL REUS W/TWL LRG LVL3 (GOWN DISPOSABLE) ×2 IMPLANT
NEEDLE HYPO 25X5/8 SAFETYGLIDE (NEEDLE) IMPLANT
NS IRRIG 1000ML POUR BTL (IV SOLUTION) ×2 IMPLANT
PACK C SECTION WH (CUSTOM PROCEDURE TRAY) ×2 IMPLANT
PAD OB MATERNITY 4.3X12.25 (PERSONAL CARE ITEMS) ×2 IMPLANT
RETRACTOR WND ALEXIS 25 LRG (MISCELLANEOUS) ×1 IMPLANT
RTRCTR C-SECT PINK 25CM LRG (MISCELLANEOUS) IMPLANT
RTRCTR WOUND ALEXIS 25CM LRG (MISCELLANEOUS) ×2
SPONGE GAUZE 4X4 12PLY STER LF (GAUZE/BANDAGES/DRESSINGS) ×4 IMPLANT
STRIP CLOSURE SKIN 1/2X4 (GAUZE/BANDAGES/DRESSINGS) ×2 IMPLANT
SUT MNCRL 0 VIOLET CTX 36 (SUTURE) ×2 IMPLANT
SUT MONOCRYL 0 CTX 36 (SUTURE) ×2
SUT PLAIN 2 0 XLH (SUTURE) ×2 IMPLANT
SUT VIC AB 0 CT1 27 (SUTURE) ×1
SUT VIC AB 0 CT1 27XBRD ANBCTR (SUTURE) ×1 IMPLANT
SUT VIC AB 2-0 CT1 27 (SUTURE) ×2
SUT VIC AB 2-0 CT1 TAPERPNT 27 (SUTURE) ×2 IMPLANT
SUT VIC AB 4-0 KS 27 (SUTURE) ×2 IMPLANT
SYR BULB IRRIGATION 50ML (SYRINGE) IMPLANT
TOWEL OR 17X24 6PK STRL BLUE (TOWEL DISPOSABLE) ×2 IMPLANT
TRAY FOLEY CATH 14FR (SET/KITS/TRAYS/PACK) ×2 IMPLANT
WATER STERILE IRR 1000ML POUR (IV SOLUTION) ×2 IMPLANT

## 2013-10-17 NOTE — Progress Notes (Addendum)
Patient ID: Ana May, female   DOB: [birth year], 33 y.o.   MRN: [id number] FACULTY PRACTICE ANTEPARTUM(COMPREHENSIVE) NOTE  Ana May is a 33 y.o. [id number] at 6256w6d by best clinical estimate who is admitted for Chronic hypertension with severe features, to rule out superimposed preeclampsia.   Fetal presentation is transverse. Length of Stay:  1  Days  Subjective: Throughout the day the patient has required several doses of IV labetalol for severe range Blood Pressures. The patient has been on Maximum doses of Labetalol 800 tid, Procardia 120 mg at noon after 60 mg this am, and is on Magnesium sulfate. Despite this, we have had to use 3 IV doses of Labetalol for persistent severe range BP's.  Patient reports the fetal movement as active. Patient reports uterine contraction  activity as none. Patient reports  vaginal bleeding as none. Patient describes fluid per vagina as None.  Vitals:  Blood pressure 155/98, pulse 95, temperature 98.4 F (36.9 C), temperature source Oral, resp. rate 22, height 5' 5.5" (1.664 m), weight 123.741 kg (272 lb 12.8 oz), SpO2 98.00%. Temp:  [97.8 F (36.6 C)-98.4 F (36.9 C)] 98.4 F (36.9 C) (08/01 1601) Pulse Rate:  [78-104] 95 (08/01 1931) Resp:  [18-22] 22 (08/01 1931) BP: (136-175)/(82-122) 155/98 mmHg (08/01 1931)  Physical Examination:  General appearance - alert, well appearing, and in no distress, overweight, well hydrated and anxious Heart - normal rate and regular rhythm Abdomen - soft, nontender, nondistended Fundal Height:  size equals dates Cervical Exam: Not evaluated. and found to be / / and fetal presentation is transverse by u/s yesterday. Extremities: extremities normal, atraumatic, no cyanosis or edema and Homans sign is negative, no sign of DVT with DTRs 2+ bilaterally Membranes:intact  Fetal Monitoring:  Baseline: 145 bpm with minimal variability, no accels or decels. (on magnesium sulfate)  Labs:  Results for orders  placed during the hospital encounter of 10/16/13 (from the past 24 hour(s))  URINE RAPID DRUG SCREEN (HOSP PERFORMED)   Collection Time    10/16/13 10:05 PM      Result Value Ref Range   Opiates NONE DETECTED  NONE DETECTED   Cocaine NONE DETECTED  NONE DETECTED   Benzodiazepines NONE DETECTED  NONE DETECTED   Amphetamines POSITIVE (*) NONE DETECTED   Tetrahydrocannabinol NONE DETECTED  NONE DETECTED   Barbiturates POSITIVE (*) NONE DETECTED  CBC   Collection Time    10/17/13  8:50 AM      Result Value Ref Range   WBC 10.7 (*) 4.0 - 10.5 K/uL   RBC 4.96  3.87 - 5.11 MIL/uL   Hemoglobin 11.9 (*) 12.0 - 15.0 g/dL   HCT 13.036.2  86.536.0 - 78.446.0 %   MCV 73.0 (*) 78.0 - 100.0 fL   MCH 24.0 (*) 26.0 - 34.0 pg   MCHC 32.9  30.0 - 36.0 g/dL   RDW 69.615.7 (*) 29.511.5 - 28.415.5 %   Platelets 216  150 - 400 K/uL  COMPREHENSIVE METABOLIC PANEL   Collection Time    10/17/13  8:50 AM      Result Value Ref Range   Sodium 137  137 - 147 mEq/L   Potassium 3.9  3.7 - 5.3 mEq/L   Chloride 102  96 - 112 mEq/L   CO2 22  19 - 32 mEq/L   Glucose, Bld 95  70 - 99 mg/dL   BUN 6  6 - 23 mg/dL   Creatinine, Ser 1.320.57  0.50 - 1.10 mg/dL   Calcium 8.6  8.4 - 10.5 mg/dL   Total Protein 7.0  6.0 - 8.3 g/dL   Albumin 2.9 (*) 3.5 - 5.2 g/dL   AST 11  0 - 37 U/L   ALT 13  0 - 35 U/L   Alkaline Phosphatase 88  39 - 117 U/L   Total Bilirubin <0.2 (*) 0.3 - 1.2 mg/dL   GFR calc non Af Amer >90  >90 mL/min   GFR calc Af Amer >90  >90 mL/min   Anion gap 13  5 - 15    Imaging Studies:     Currently EPIC will not allow sonographic studies to automatically populate into notes.  In the meantime, copy and paste results into note or free text.  Medications:  Scheduled . aspirin  81 mg Oral Daily  . docusate sodium  100 mg Oral Daily  . famotidine  20 mg Oral BID  . labetalol  800 mg Oral 3 times per day  . NIFEdipine  120 mg Oral BID  . prenatal multivitamin  1 tablet Oral Q1200   I have reviewed the patient's current  medications.  ASSESSMENT: Patient Active Problem List   Diagnosis Date Noted  . Chronic hypertension with exacerbation during pregnancy 10/16/2013  . Hx of drug abuse 10/16/2013  . Previous cesarean delivery, antepartum condition or complication 07/07/2013  . Obesity affecting pregnancy, antepartum 07/07/2013  . H/O previous obstetrical problem 07/07/2013  . Chronic hypertension in pregnancy 07/07/2013  . Clinical depression 07/07/2013  . Encounter for antenatal screening of mother 07/06/2013    PLAN: I have reviewed the persistent severe BP's despite the 2 drugs at maximum dosing while on Magnesium Sulfate, and discussed the case with Dr Claudean Severance .  He supports the recommendation for proceeding to delivery. The patient has been presented with the rationale for preterm delivery, for severe features chronic hypertension, and accepts the recommendation. Additionally the patient desires permanent sterilization, regardless of the condition of the baby. She has been counseled over the permanency of the requested sterilization, and requests that this be done as part of the procedure.  Consents for repeat cesarean, and separate consent for sterilization obtained.  Pt last ate at  3 pm, grapes, with full meal at noon. Will prepare cesarean section for 23:00.  Ancef to be administered at cesarean. Operating Room notified. Cbc, T&C x 2 units ordered.  Brewster Wolters V 10/17/2013,7:39 PM

## 2013-10-17 NOTE — Progress Notes (Signed)
Patient ID: Ana May, female   DOB: 1980-10-28, 33 y.o.   MRN: [id number] FACULTY PRACTICE ANTEPARTUM(COMPREHENSIVE) NOTE  Ana May is a 33 y.o. [id number] at 1123w6d by best clinical estimate who is admitted for elevated BP.   Fetal presentation is unsure. Length of Stay:  1  Days  Subjective: Feels ok.  Denies h/a, vission changes, RUQ pain Patient reports the fetal movement as active. Patient reports uterine contraction  activity as none. Patient reports  vaginal bleeding as none. Patient describes fluid per vagina as None.  Vitals:  Blood pressure 155/110, pulse 90, temperature 98 F (36.7 C), temperature source Oral, resp. rate 20, height 5' 5.5" (1.664 m), weight 272 lb 12.8 oz (123.741 kg), SpO2 98.00%. Physical Examination:  General appearance - alert, well appearing, and in no distress Abdomen - gravid NT Fundal Height:  size equals dates Extremities: edema 2+  Membranes:intact  Fetal Monitoring:  Baseline: 130 bpm, Variability: Good {> 6 bpm), Accelerations: Non-reactive but appropriate for gestational age and Decelerations: Absent  Labs:  Results for orders placed during the hospital encounter of 10/16/13 (from the past 24 hour(s))  URINALYSIS, ROUTINE W REFLEX MICROSCOPIC   Collection Time    10/16/13  4:00 PM      Result Value Ref Range   Color, Urine YELLOW  YELLOW   APPearance CLEAR  CLEAR   Specific Gravity, Urine 1.020  1.005 - 1.030   pH 6.5  5.0 - 8.0   Glucose, UA NEGATIVE  NEGATIVE mg/dL   Hgb urine dipstick NEGATIVE  NEGATIVE   Bilirubin Urine NEGATIVE  NEGATIVE   Ketones, ur NEGATIVE  NEGATIVE mg/dL   Protein, ur NEGATIVE  NEGATIVE mg/dL   Urobilinogen, UA 1.0  0.0 - 1.0 mg/dL   Nitrite NEGATIVE  NEGATIVE   Leukocytes, UA NEGATIVE  NEGATIVE  PROTEIN / CREATININE RATIO, URINE   Collection Time    10/16/13  4:00 PM      Result Value Ref Range   Creatinine, Urine 246.51     Total Protein, Urine 24.4     PROTEIN CREATININE RATIO 0.10   0.00 - 0.15  COMPREHENSIVE METABOLIC PANEL   Collection Time    10/16/13  4:22 PM      Result Value Ref Range   Sodium 135 (*) 137 - 147 mEq/L   Potassium 4.2  3.7 - 5.3 mEq/L   Chloride 98  96 - 112 mEq/L   CO2 25  19 - 32 mEq/L   Glucose, Bld 91  70 - 99 mg/dL   BUN 7  6 - 23 mg/dL   Creatinine, Ser 1.470.63  0.50 - 1.10 mg/dL   Calcium 9.3  8.4 - 82.910.5 mg/dL   Total Protein 6.6  6.0 - 8.3 g/dL   Albumin 2.8 (*) 3.5 - 5.2 g/dL   AST 11  0 - 37 U/L   ALT 14  0 - 35 U/L   Alkaline Phosphatase 82  39 - 117 U/L   Total Bilirubin <0.2 (*) 0.3 - 1.2 mg/dL   GFR calc non Af Amer >90  >90 mL/min   GFR calc Af Amer >90  >90 mL/min   Anion gap 12  5 - 15  CBC   Collection Time    10/16/13  4:22 PM      Result Value Ref Range   WBC 10.3  4.0 - 10.5 K/uL   RBC 4.72  3.87 - 5.11 MIL/uL   Hemoglobin 11.4 (*) 12.0 - 15.0 g/dL  HCT 34.9 (*) 36.0 - 46.0 %   MCV 73.9 (*) 78.0 - 100.0 fL   MCH 24.2 (*) 26.0 - 34.0 pg   MCHC 32.7  30.0 - 36.0 g/dL   RDW 16.1  09.6 - 04.5 %   Platelets 212  150 - 400 K/uL  URINE RAPID DRUG SCREEN (HOSP PERFORMED)   Collection Time    10/16/13 10:05 PM      Result Value Ref Range   Opiates NONE DETECTED  NONE DETECTED   Cocaine NONE DETECTED  NONE DETECTED   Benzodiazepines NONE DETECTED  NONE DETECTED   Amphetamines POSITIVE (*) NONE DETECTED   Tetrahydrocannabinol NONE DETECTED  NONE DETECTED   Barbiturates POSITIVE (*) NONE DETECTED     Medications:  Scheduled . aspirin  81 mg Oral Daily  . docusate sodium  100 mg Oral Daily  . famotidine  20 mg Oral BID  . labetalol  800 mg Oral 3 times per day  . NIFEdipine  120 mg Oral BID  . prenatal multivitamin  1 tablet Oral Q1200   I have reviewed the patient's current medications.  ASSESSMENT: Patient Active Problem List   Diagnosis Date Noted  . Chronic hypertension with exacerbation during pregnancy 10/16/2013  . Hx of drug abuse 10/16/2013  . Previous cesarean delivery, antepartum condition or  complication 07/07/2013  . Obesity affecting pregnancy, antepartum 07/07/2013  . H/O previous obstetrical problem 07/07/2013  . Chronic hypertension in pregnancy 07/07/2013  . Clinical depression 07/07/2013  . Encounter for antenatal screening of mother 07/06/2013    PLAN: BP remains up despite max doses labetalol Will increase procardia If BP control is not achieved would need to move toward delivery Pt. Strongly desires TOLAC--risks discussed. She does not want NICU consult. Repeat labs 24 hour urine in progress.  Reva Bores, MD 10/17/2013,8:00 AM

## 2013-10-17 NOTE — Anesthesia Procedure Notes (Signed)
Spinal  Patient location during procedure: OB Start time: 10/17/2013 10:01 PM End time: 10/17/2013 10:08 PM Staffing Anesthesiologist: Achaia Garlock, CHRIS Preanesthetic Checklist Completed: patient identified, surgical consent, pre-op evaluation, timeout performed, IV checked, risks and benefits discussed and monitors and equipment checked Spinal Block Patient position: sitting Prep: site prepped and draped and DuraPrep Patient monitoring: heart rate, cardiac monitor, continuous pulse ox and blood pressure Approach: midline Location: L4-5 Injection technique: single-shot Needle Needle type: Pencan  Needle gauge: 24 G Needle length: 10 cm Assessment Sensory level: T6

## 2013-10-17 NOTE — Anesthesia Preprocedure Evaluation (Signed)
Anesthesia Evaluation  Patient identified by MRN, date of birth, ID band Patient awake    Reviewed: Allergy & Precautions, H&P , NPO status , Patient's Chart, lab work & pertinent test results  History of Anesthesia Complications Negative for: history of anesthetic complications  Airway Mallampati: III TM Distance: >3 FB Neck ROM: Full    Dental  (+) Teeth Intact   Pulmonary neg shortness of breath, neg sleep apnea, neg COPDneg recent URI, former smoker,          Cardiovascular hypertension, Pt. on medications and Pt. on home beta blockers Rhythm:Regular     Neuro/Psych PSYCHIATRIC DISORDERS Depression negative neurological ROS     GI/Hepatic negative GI ROS, Neg liver ROS,   Endo/Other  neg diabetesMorbid obesity  Renal/GU negative Renal ROS     Musculoskeletal   Abdominal   Peds  Hematology negative hematology ROS (+)   Anesthesia Other Findings   Reproductive/Obstetrics                           Anesthesia Physical Anesthesia Plan  ASA: III and emergent  Anesthesia Plan: Spinal   Post-op Pain Management:    Induction:   Airway Management Planned: Natural Airway  Additional Equipment: None  Intra-op Plan:   Post-operative Plan:   Informed Consent: I have reviewed the patients History and Physical, chart, labs and discussed the procedure including the risks, benefits and alternatives for the proposed anesthesia with the patient or authorized representative who has indicated his/her understanding and acceptance.   Dental advisory given  Plan Discussed with: CRNA and Surgeon  Anesthesia Plan Comments:         Anesthesia Quick Evaluation

## 2013-10-17 NOTE — Transfer of Care (Signed)
Immediate Anesthesia Transfer of Care Note  Patient: Ana May  Procedure(s) Performed: Procedure(s): CESAREAN SECTION, BILATERAL TUBAL LIGATION (N/A)  Patient Location: PACU  Anesthesia Type:Spinal  Level of Consciousness: awake, alert  and oriented  Airway & Oxygen Therapy: Patient Spontanous Breathing  Post-op Assessment: Report given to PACU RN and Post -op Vital signs reviewed and stable  Post vital signs: Reviewed and stable  Complications: No apparent anesthesia complications

## 2013-10-17 NOTE — Consult Note (Signed)
Neonatology Consult  Note:  At the request of the patients obstetrician Dr. Glo Herring I met with Ana May who is at 78 2 weeks currently with pregnancy complicated by Chronic hypertension with exacerbation during pregnancy.  She continues to have severe range blood pressures on maximum doses of labetalol and procardia.  Other complications include history of drug abuse (THC), tobacco use, obesity, depression.  Recently admitted at [facility] for hypertensive emergency and received BMZ on 7/23-24.  We discussed morbidity/mortality at this gestional age, delivery room resuscitation, including intubation and surfactant in DR.  Discussed mechanical ventilation and risk for chronic lung disease, risk for IVH with potential for motor / cognitive deficits, ROP, NEC, sepsis, as well as temperature instability and feeding immaturity.  Discussed NG / OG feeds, benefits of MBM in reducing incidence of NEC.   Discussed likely length of stay.  Thank you for allowing [place] to participate in her care.   Higinio Roger, DO  Neonatologist  The total length of face-to-face or floor / unit time for this encounter was 20 minutes.  Counseling and / or coordination of care was greater than fifty percent of the time.

## 2013-10-18 DIAGNOSIS — Z6841 Body Mass Index (BMI) 40.0 and over, adult: Secondary | ICD-10-CM

## 2013-10-18 DIAGNOSIS — O34219 Maternal care for unspecified type scar from previous cesarean delivery: Secondary | ICD-10-CM

## 2013-10-18 DIAGNOSIS — I10 Essential (primary) hypertension: Secondary | ICD-10-CM

## 2013-10-18 DIAGNOSIS — IMO0002 Reserved for concepts with insufficient information to code with codable children: Secondary | ICD-10-CM

## 2013-10-18 LAB — HIV ANTIBODY (ROUTINE TESTING W REFLEX): HIV: NONREACTIVE

## 2013-10-18 LAB — CBC
HCT: 32.4 % — ABNORMAL LOW (ref 36.0–46.0)
HEMOGLOBIN: 10.6 g/dL — AB (ref 12.0–15.0)
MCH: 24.3 pg — ABNORMAL LOW (ref 26.0–34.0)
MCHC: 32.7 g/dL (ref 30.0–36.0)
MCV: 74.3 fL — ABNORMAL LOW (ref 78.0–100.0)
Platelets: 207 10*3/uL (ref 150–400)
RBC: 4.36 MIL/uL (ref 3.87–5.11)
RDW: 15.7 % — AB (ref 11.5–15.5)
WBC: 16 10*3/uL — AB (ref 4.0–10.5)

## 2013-10-18 LAB — PROTEIN, URINE, 24 HOUR
Collection Interval-UPROT: 24 hours
PROTEIN 24H UR: 134 mg/d — AB (ref 50–100)
Protein, Urine: 4 mg/dL
URINE TOTAL VOLUME-UPROT: 3350 mL

## 2013-10-18 LAB — HEPATITIS B SURFACE ANTIGEN: Hepatitis B Surface Ag: NEGATIVE

## 2013-10-18 LAB — RPR

## 2013-10-18 LAB — ABO/RH: ABO/RH(D): O POS

## 2013-10-18 MED ORDER — SODIUM CHLORIDE 0.9 % IJ SOLN: 3.0000 mL | INTRAMUSCULAR | Status: DC | PRN

## 2013-10-18 MED ORDER — METOCLOPRAMIDE HCL 5 MG/ML IJ SOLN: 10.0000 mg | Freq: Three times a day (TID) | INTRAMUSCULAR | Status: DC | PRN

## 2013-10-18 MED ORDER — SIMETHICONE 80 MG PO CHEW
80.0000 mg | CHEWABLE_TABLET | ORAL | Status: DC
  Administered 2013-10-19 – 2013-10-20 (×2): 80 mg via ORAL
  Filled 2013-10-18 (×2): qty 1

## 2013-10-18 MED ORDER — NALBUPHINE HCL 10 MG/ML IJ SOLN: 5.0000 mg | INTRAMUSCULAR | Status: DC | PRN

## 2013-10-18 MED ORDER — DIPHENHYDRAMINE HCL 25 MG PO CAPS
25.0000 mg | ORAL_CAPSULE | Freq: Four times a day (QID) | ORAL | Status: DC | PRN
  Administered 2013-10-18: 25 mg via ORAL
  Filled 2013-10-18: qty 1

## 2013-10-18 MED ORDER — MENTHOL 3 MG MT LOZG: 1.0000 | LOZENGE | OROMUCOSAL | Status: DC | PRN

## 2013-10-18 MED ORDER — SIMETHICONE 80 MG PO CHEW: 80.0000 mg | CHEWABLE_TABLET | ORAL | Status: DC | PRN

## 2013-10-18 MED ORDER — IBUPROFEN 600 MG PO TABS
600.0000 mg | ORAL_TABLET | Freq: Four times a day (QID) | ORAL | Status: DC
  Administered 2013-10-18 – 2013-10-20 (×10): 600 mg via ORAL
  Filled 2013-10-18 (×10): qty 1

## 2013-10-18 MED ORDER — SENNOSIDES-DOCUSATE SODIUM 8.6-50 MG PO TABS
2.0000 | ORAL_TABLET | ORAL | Status: DC
  Administered 2013-10-19 – 2013-10-20 (×2): 2 via ORAL
  Filled 2013-10-18 (×2): qty 2

## 2013-10-18 MED ORDER — FENTANYL CITRATE 0.05 MG/ML IJ SOLN
INTRAMUSCULAR | Status: AC
  Filled 2013-10-18: qty 2

## 2013-10-18 MED ORDER — WITCH HAZEL-GLYCERIN EX PADS
1.0000 | MEDICATED_PAD | CUTANEOUS | Status: DC | PRN
Start: 2013-10-18 — End: 2013-10-20

## 2013-10-18 MED ORDER — SIMETHICONE 80 MG PO CHEW
80.0000 mg | CHEWABLE_TABLET | Freq: Three times a day (TID) | ORAL | Status: DC
  Administered 2013-10-18 – 2013-10-20 (×7): 80 mg via ORAL
  Filled 2013-10-18 (×7): qty 1

## 2013-10-18 MED ORDER — DIBUCAINE 1 % RE OINT: 1.0000 "application " | TOPICAL_OINTMENT | RECTAL | Status: DC | PRN

## 2013-10-18 MED ORDER — NALOXONE HCL 0.4 MG/ML IJ SOLN: 0.4000 mg | INTRAMUSCULAR | Status: DC | PRN

## 2013-10-18 MED ORDER — OXYCODONE-ACETAMINOPHEN 5-325 MG PO TABS
1.0000 | ORAL_TABLET | ORAL | Status: DC | PRN
  Administered 2013-10-18: 1 via ORAL
  Administered 2013-10-18 – 2013-10-20 (×6): 2 via ORAL
  Filled 2013-10-18 (×7): qty 2

## 2013-10-18 MED ORDER — ZOLPIDEM TARTRATE 5 MG PO TABS
5.0000 mg | ORAL_TABLET | Freq: Every evening | ORAL | Status: DC | PRN
  Administered 2013-10-19 – 2013-10-20 (×2): 5 mg via ORAL
  Filled 2013-10-18 (×2): qty 1

## 2013-10-18 MED ORDER — NALBUPHINE HCL 10 MG/ML IJ SOLN
INTRAMUSCULAR | Status: AC
  Filled 2013-10-18: qty 1

## 2013-10-18 MED ORDER — LANOLIN HYDROUS EX OINT: 1.0000 "application " | TOPICAL_OINTMENT | CUTANEOUS | Status: DC | PRN

## 2013-10-18 MED ORDER — OXYTOCIN 40 UNITS IN LACTATED RINGERS INFUSION - SIMPLE MED
62.5000 mL/h | INTRAVENOUS | Status: AC
Start: 2013-10-18 — End: 2013-10-18
  Administered 2013-10-18: 62.5 mL/h via INTRAVENOUS

## 2013-10-18 MED ORDER — NALBUPHINE HCL 10 MG/ML IJ SOLN
5.0000 mg | INTRAMUSCULAR | Status: DC | PRN
  Administered 2013-10-18: 10 mg via INTRAVENOUS

## 2013-10-18 MED ORDER — PRENATAL MULTIVITAMIN CH
1.0000 | ORAL_TABLET | Freq: Every day | ORAL | Status: DC
  Administered 2013-10-18 – 2013-10-20 (×3): 1 via ORAL
  Filled 2013-10-18 (×3): qty 1

## 2013-10-18 MED ORDER — TETANUS-DIPHTH-ACELL PERTUSSIS 5-2.5-18.5 LF-MCG/0.5 IM SUSP
0.5000 mL | Freq: Once | INTRAMUSCULAR | Status: AC
  Administered 2013-10-19: 0.5 mL via INTRAMUSCULAR
  Filled 2013-10-18: qty 0.5

## 2013-10-18 MED ORDER — ONDANSETRON HCL 4 MG/2ML IJ SOLN: 4.0000 mg | Freq: Three times a day (TID) | INTRAMUSCULAR | Status: DC | PRN

## 2013-10-18 MED ORDER — NALOXONE HCL 1 MG/ML IJ SOLN
1.0000 ug/kg/h | INTRAMUSCULAR | Status: DC | PRN
  Filled 2013-10-18: qty 2

## 2013-10-18 MED ORDER — ONDANSETRON HCL 4 MG PO TABS: 4.0000 mg | ORAL_TABLET | ORAL | Status: DC | PRN

## 2013-10-18 MED ORDER — LACTATED RINGERS IV SOLN
INTRAVENOUS | Status: DC
  Administered 2013-10-18: 16:00:00 via INTRAVENOUS

## 2013-10-18 MED ORDER — ONDANSETRON HCL 4 MG/2ML IJ SOLN: 4.0000 mg | INTRAMUSCULAR | Status: DC | PRN

## 2013-10-18 MED ORDER — OXYTOCIN 40 UNITS IN LACTATED RINGERS INFUSION - SIMPLE MED
INTRAVENOUS | Status: AC
  Filled 2013-10-18: qty 1000

## 2013-10-18 NOTE — Progress Notes (Signed)
10/18/13 0602  Vitals  BP ! 155/102 mmHg (Post ambulation MD made aware)  Was instructed to give 0700 hr Labetalol 800 mg po. Order carried out. We will continue to monitor.

## 2013-10-18 NOTE — Addendum Note (Signed)
Addendum created 10/18/13 [id number] by Graciela HusbandsWynn O Rayla Pember, CRNA   Modules edited: Notes Section   Notes Section:  File: [id number]

## 2013-10-18 NOTE — Anesthesia Postprocedure Evaluation (Signed)
Anesthesia Post Note  Patient: Ana DunksShenella May  Procedure(s) Performed: Procedure(s) (LRB): CESAREAN SECTION, BILATERAL TUBAL LIGATION (N/A)  Anesthesia type: Spinal  Patient location: Mother/Baby  Post pain: Pain level controlled  Post assessment: Post-op Vital signs reviewed  Last Vitals:  Filed Vitals:   10/18/13 0800  BP: 133/97  Pulse: 89  Temp:   Resp: 20    Post vital signs: Reviewed  Level of consciousness: awake  Complications: No apparent anesthesia complications

## 2013-10-18 NOTE — Anesthesia Postprocedure Evaluation (Signed)
  Anesthesia Post-op Note  Patient: [name]  Procedure(s) Performed: Procedure(s): CESAREAN SECTION, BILATERAL TUBAL LIGATION (N/A)  Patient Location: PACU  Anesthesia Type:Spinal  Level of Consciousness: awake  Airway and Oxygen Therapy: Patient Spontanous Breathing  Post-op Pain: none  Post-op Assessment: Post-op Vital signs reviewed, Patient's Cardiovascular Status Stable, Respiratory Function Stable, Patent Airway, No signs of Nausea or vomiting, Adequate PO intake and Pain level controlled  Post-op Vital Signs: Reviewed and stable  Last Vitals:  Filed Vitals:   10/18/13 0542  BP: 128/98  Pulse: 117  Temp: 36.6 C  Resp:     Complications: No apparent anesthesia complications

## 2013-10-18 NOTE — Progress Notes (Signed)
10/18/13 1855  Vitals  BP ! 176/98 mmHg  MAP (mmHg) 118  Pulse Rate 100  Oxygen Therapy  SpO2 100 %  Dr. Jolayne Pantheronstant made aware of recent BP's-will give Labetalol dose now that was held earlier.

## 2013-10-18 NOTE — Progress Notes (Signed)
Subjective: Postpartum Day 1: Cesarean Delivery Patient reports incisional pain and tolerating PO.    Objective: Vital signs in last 24 hours: Temp:  [97.4 F (36.3 C)-98.5 F (36.9 C)] 97.8 F (36.6 C) (08/02 0542) Pulse Rate:  [66-117] 81 (08/02 0700) Resp:  [13-30] 18 (08/02 0421) BP: (128-175)/(67-122) 130/84 mmHg (08/02 0700) SpO2:  [96 %-100 %] 100 % (08/02 0700) Weight:  [124.059 kg (273 lb 8 oz)] 124.059 kg (273 lb 8 oz) (08/02 0239)  Intake/Output Summary (Last 24 hours) at 10/18/13 0745 Last data filed at 10/18/13 0600  Gross per 24 hour  Intake 6235.17 ml  Output   3950 ml  Net 2285.17 ml    Physical Exam:  General: alert, cooperative and no distress Lochia: appropriate Uterine Fundus: firm Incision: no significant drainage DVT Evaluation: No evidence of DVT seen on physical exam.   Recent Labs  10/17/13 2020 10/18/13 0515  HGB 12.0 10.6*  HCT 36.0 32.4*    Assessment/Plan: Chronic HTN , severe features,  Status post Cesarean section. [facility]  Doing well postoperatively.  Continue current care d/c mag sulfate at 2237.  Chadd Tollison V 10/18/2013, 7:45 AM

## 2013-10-18 NOTE — Progress Notes (Signed)
10/18/13 1819  Vitals  BP ! 170/107 mmHg  MAP (mmHg) 122  pt anxious about getting down to the NICU to see her baby.

## 2013-10-18 NOTE — Brief Op Note (Signed)
10/16/2013 - 10/17/2013  12:18 AM  PATIENT:  Benna DunksShenella Kines  33 y.o. female  PRE-OPERATIVE DIAGNOSIS:  repeat cesarian section with bilateral tubal ligation pregnancy 3 30 weeks 6 days chronic hypertension with severe features  POST-OPERATIVE DIAGNOSIS:  repeat cesarian section with bilateral tubal ligation pregnancy 30 weeks 6 days chronic hypertension with severe features  PROCEDURE:  Procedure(s): CESAREAN SECTION, BILATERAL TUBAL LIGATION (N/A), lower uterine segment vertical incision  SURGEON:  Surgeon(s) and Role:    * Tilda BurrowJohn Khian Remo V, MD - Primary  PHYSICIAN ASSISTANLoreta Ave: [place] M.D.  ASSISTANTS: none none   ANESTHESIA:   spinal  EBL:  Total I/O In: 1750 [I.V.:1750] Out: 1600 [Urine:900; Blood:700]  BLOOD ADMINISTERED:none  DRAINS: Urinary Catheter (Foley)   LOCAL MEDICATIONS USED:  NONE  SPECIMEN:  Source of Specimen:  Placenta  DISPOSITION OF SPECIMEN:  Pathology  COUNTS:  YES  TOURNIQUET:  * No tourniquets in log *  DICTATION: .Dragon Dictation  PLAN OF CARE: Has had orders for admission already  PATIENT DISPOSITION:  ICU - extubated and stable.   Delay start of Pharmacological VTE agent (>24hrs) due to surgical blood loss or risk of bleeding: not applicable

## 2013-10-18 NOTE — Op Note (Signed)
10/16/2013 - 10/17/2013  12:18 AM  PATIENT:  Ana May  33 y.o. female  PRE-OPERATIVE DIAGNOSIS:  repeat cesarian section with bilateral tubal ligation pregnancy 3 30 weeks 6 days chronic hypertension with severe features  POST-OPERATIVE DIAGNOSIS:  repeat cesarian section with bilateral tubal ligation pregnancy 30 weeks 6 days chronic hypertension with severe features  PROCEDURE:  Procedure(s): CESAREAN SECTION, BILATERAL TUBAL LIGATION (N/A), lower uterine segment vertical incision  SURGEON:  Surgeon(s) and Role:    * Tilda Burrow, MD - Primary  PHYSICIAN ASSISTANTLoreta Ave M.D.  ASSISTANTS: none none   ANESTHESIA:   spinal  EBL:  Total I/O In: 1750 [I.V.:1750] Out: 1600 [Urine:900; Blood:700]  BLOOD ADMINISTERED:none  DRAINS: Urinary Catheter (Foley)   LOCAL MEDICATIONS USED:  NONE  SPECIMEN:  Source of Specimen:  Placenta  DISPOSITION OF SPECIMEN:  Pathology  COUNTS:  YES  TOURNIQUET:  * No tourniquets in log *  DICTATION: .Dragon Dictation  PLAN OF CARE: Has had orders for admission already  PATIENT DISPOSITION:  ICU - extubated and stable.   Delay start of Pharmacological VTE agent (>24hrs) due to surgical blood loss or risk of bleeding: not applicable  10/16/2013 - 10/17/2013  12:18 AMDetails of procedure: Patient was taken operating room prepped and draped lower abdominal surgery with timeout conducted, Ancef 3 g administered intravenously, and procedure started by excising the old deeply retracted scar there was fibrosis and subcutaneous tissue which was released, and a transverse lower abdominal incision performed sharply and dissected off the rectus muscles moderate difficulty. The omentum was stuck down in the incision could be freed up by crossclamping across the tip of the omental, transecting and ligating the pedicles with 2-0 Vicryl. The peritoneal cavity then was much more open. With careful sharp dissection staying within fatty tissue planes we  were able to open up the fibrosis then held in rectus muscles together in the midline just above the symphysis pubis. This finally (for placement of the Alexis retractor. There were additionally firm fibrous attachments from the anterior wall of the uterus to the lower uterine anterior abdominal wall. These required sharp transection. On palpation of the uterus could identify the position of the uterus the lower uterine segment was undeveloped so that a vertical lower uterine incision was necessary to reach the endometrial cavity and the infant was delivered. The baby was in a back up transverse position square identified on the left side, pulled in the incision and the baby delivered by breech delivery, delivering buttocks, flexing the knees to the legs, sweeping the arms, delivering the head and using fundal pressure to expel the head. Cord was clamped. The baby cried vigorously. Baby was passed to Dr. Algernon Huxley: See his notes for further information regarding the baby . Placenta delivered slowly with uterine massage required manual extraction eventually. Membranes were intact. The os was swabbed and felt that there was no residual membranes. The uterus was closed in 2 layer fashion running locking first layer in continuous running second layer with good hemostasis. Tubal ligation: Tubal ligation was performed by identifying each fallopian tube to its fimbriated end and placing a Filshie clip at the midportion of the tube on each side fallopian tubes appeared grossly normal as did the ovaries. Next The anterior peritoneum was closed in a 2-0 Vicryl, fascia closed with 0 Vicryl, the subcutaneous tissues approximated, released sufficiently to eliminate the fibrous adhesions, and then the skin closed with subcuticular 4-0 Vicryl closure on the Keith needle sponge and needle counts were  correct condition to recovery in good condition

## 2013-10-18 NOTE — Progress Notes (Signed)
Clinical Social Work Department PSYCHOSOCIAL ASSESSMENT - MATERNAL/CHILD 10/18/2013  Patient:  Ana May, Ana May  Account Number:  [id number]  [place] Date:  10/16/2013  Ardine Eng Name:   Lendon Ka    Clinical Social Worker:  Mela Perham, LCSW   Date/Time:  10/18/2013 03:00 PM  Date Referred:  10/18/2013   Referral source  NICU     Referred reason  NICU   Other referral source:    I:  FAMILY / [place] legal guardian:  PARENT  Guardian - Name Guardian - Age Guardian - Address  [place] [address]  [place], [place] [address]  Jones Broom     Other household support members/support persons Other support:    II  PSYCHOSOCIAL DATA Information Source:    Occupational hygienist Employment:   Mother is employed   Museum/gallery curator resources:  [employer] If [place]:   Librarian, academic  [place] / Grade:   Maternity Care Coordinator / Child Services Coordination / Early Interventions:  Cultural issues impacting care:    III  STRENGTHS Strengths  Understanding of illness  Adequate Resources   Strength comment:    IV  RISK FACTORS AND CURRENT PROBLEMS Current Problem:       V  SOCIAL WORK ASSESSMENT Met with mother who was pleasant and receptive to social work intervention.  She is a single parent with one other dependent age 57.  FOB is uninvolved, and mother states that she does not expect him to be supportive.   Mother is employed and plans to return to work.  She is familiar with NICU as her 33 year old was born premature and remained in the NICU for about 3 weeks.  Informed that she has spoken with the medical team about newborn.  Mother reports hx of depression and anxiety.  Informed that she received mental health services at [facility] and was also prescribed medication which she did not take.  Mother states that she did well with therapy and communicates plans to resume therapy with [facility].  Mother  admits to occasional use of marijuana but noted that she did not use any once she became aware of the pregnancy.  Mother states "It was stupid of me to use it".  She denies need for treatment. She denies any other drug use.  Her UDS was positive for amphetamines and barbiturates.  She was perplexed by this and denies taking any medication other than tagamet for reflux, and denied any illicit drug use.   UDS on newborn was negative.   She reports having transportation to visit with newborn after she'd discharged.  No acute social concerns related at this time.  Mother informed of CSW availability.      VI SOCIAL WORK PLAN Social Work Plan  Psychosocial Support/Ongoing Assessment of Needs   Type of pt/family education:   PP Depression  Hospital's drug screen policy  Discussed coping strategies to reduce anxiety and manage depression  Harmful effects of marijuana use   If child protective services report - county:   If child protective services report - date:   Information/referral to community resources comment:   Other social work plan:   Will continue to monitor drug screen and provide emotional support.

## 2013-10-19 ENCOUNTER — Encounter (HOSPITAL_COMMUNITY): Payer: Self-pay | Admitting: Obstetrics and Gynecology

## 2013-10-19 NOTE — Progress Notes (Signed)
Subjective: Postpartum Day 2: Cesarean Delivery with BTL Patient reports tolerating PO, + flatus and no problems voiding.  She reports a h/o depression but declines medication.  Reports that 'crying is her therapy'. Pt reports that she is 'ok' and did not want to discuss why she was crying.  Pt denies HA or visual changes.  Objective: Vital signs in last 24 hours: Temp:  [98.2 F (36.8 C)-98.5 F (36.9 C)] 98.3 F (36.8 C) (08/03 0800) Pulse Rate:  [85-108] 88 (08/03 1100) Resp:  [16-20] 18 (08/03 1100) BP: (112-176)/(68-107) 147/83 mmHg (08/03 1100) SpO2:  [98 %-100 %] 99 % (08/03 0800)  Physical Exam:  General: alert, no distress and crying (softly) Lochia: appropriate Uterine Fundus: firm Incision: dressing clean and dry DVT Evaluation: No evidence of DVT seen on physical exam.   Recent Labs  10/17/13 2020 10/18/13 0515  HGB 12.0 10.6*  HCT 36.0 32.4*    Assessment/Plan: Status post Cesarean section. Doing well postoperatively.  Pt with h/o depression declining further treatmemtn. Reports that if things get worse whe will ask for treatment. i explained to her the relation between depression and the post partum state.  rec readdressing prior to discharge. Social work consult   Transfer out of AICU Keep Labetalol 800mg  tid   HARRAWAY-SMITH, Nishanth Mccaughan 10/19/2013, 1:34 PM

## 2013-10-19 NOTE — Progress Notes (Signed)
UR chart review completed.  

## 2013-10-19 NOTE — Progress Notes (Signed)
Pt [facility] her [facility]ICU x [facility] and the other independently. Pt  stated that she had a good interaction with her baby and was able to provided skin to skin  Care.  Pt took Ambien 5 mg PO and slept throughout the night.

## 2013-10-19 NOTE — Lactation Note (Addendum)
This note was copied from the chart of [place]. Lactation Consultation Note      Initial consult with this mom of a NICU baby, now 23 hours old, and 31 1/7 weeks CGA, weighing 3 lbs 10.6 oz. Mom was doing skin to skin with her baby swhen I met her, and later met with her in AICu ro review pumping and hand expresion. Mom does nto have and expressabe colostrum at this time, but i encouraged her to pump every 3  hours, 8 times a day, followed by hand expresion. I toldher it takes 3-5 days to see mature milk, and that even a drop should be taken to her baby. I reivewed the NICU booklet with mom on providing EBM for a NICU baby, and call [place] [facility], and left them a message that mom will need a  DEP in a day or tow. i also encouraged mom to call [facility] herself, and leave her number.  Lactation services also reviewed with mom. Patient Name: Ana May [id number] Date: 10/19/2013     Maternal Data    Feeding Feeding Type: Formula Length of feed: 5 min  LATCH Score/Interventions                      Lactation Tools Discussed/Used     Consult Status      [name] 10/19/2013, 12:30 PM

## 2013-10-20 LAB — TYPE AND SCREEN
ABO/RH(D): O POS
ANTIBODY SCREEN: NEGATIVE
UNIT DIVISION: 0
Unit division: 0

## 2013-10-20 LAB — RUBELLA SCREEN: Rubella: 3.06 Index — ABNORMAL HIGH (ref <0.90)

## 2013-10-20 MED ORDER — OXYCODONE-ACETAMINOPHEN 5-325 MG PO TABS: 1.0000 | ORAL_TABLET | ORAL | Status: DC | PRN

## 2013-10-20 MED ORDER — LABETALOL HCL 200 MG PO TABS: 800.0000 mg | ORAL_TABLET | Freq: Three times a day (TID) | ORAL | Status: DC

## 2013-10-20 MED ORDER — IBUPROFEN 600 MG PO TABS: 600.0000 mg | ORAL_TABLET | Freq: Four times a day (QID) | ORAL | Status: DC

## 2013-10-20 NOTE — Progress Notes (Signed)
Pt discharged to home with friend.  Condition stable.  Pt ambulated to car with RN.  Pt home with hand breast pump, [facility]  No equipment for home ordered at discharge.

## 2013-10-20 NOTE — Lactation Note (Signed)
This note was copied from the chart of Ana May. Lactation Consultation Note WIC loaner DEBP offered for $30.00 for a week until she gets a pump [facility]. Pt. Stated she didn't have the $30.00 and she was having a hard time finding someone to come and get her from the hospital. Mom didn't have a hand pump, I gave her manual pump and fitted flanges for correct size. Encouraged mom to cont. To stimulate her breast every three hours. Extra bottles given w/instructions for milk storage.  Patient Name: Ana May ZOXWR'UToday's Date: 10/20/2013     Maternal Data    Feeding Feeding Type: Formula Length of feed: 30 min  LATCH Score/Interventions                      Lactation Tools Discussed/Used     Consult Status      Shubham Thackston G 10/20/2013, 1:28 PM

## 2013-10-20 NOTE — Discharge Summary (Addendum)
Obstetric Discharge Summary Reason for Admission: severe preeclampsia Prenatal Procedures: NST and Preeclampsia Intrapartum Procedures: cesarean: classical and tubal ligation Postpartum Procedures: Magnesium sulfate Complications-Operative and Postpartum: none Hemoglobin  Date Value Ref Range Status  10/18/2013 10.6* 12.0 - 15.0 g/dL Final     HCT  Date Value Ref Range Status  10/18/2013 32.4* 36.0 - 46.0 % Final    Physical Exam:  General: alert and no distress Lochia: appropriate Uterine Fundus: firm Incision: dressing clean and dry DVT Evaluation: No evidence of DVT seen on physical exam.  Discharge Diagnoses: Preterm delviery.  Chronic hypertension with superimposed preeclampsia Pt reports no HA or visual changes overnight. She is eating and voiding without difficulty.  She is passing flatus.  She reports that she wants to continue her care in [place].    Discharge Information: Date: 10/20/2013 Activity: unrestricted Diet: Heart healthy Medications: Ibuprofen and Percocet Labetolol 800mg  tid Condition: stable Instructions: refer to practice specific booklet Discharge to: home Follow-up Information   [facility] up with [facility] In 2 weeks.      Newborn Data: Live born female  Birth Weight: 3 lb 10.9 oz (1670 g) APGAR: 8, 9  Baby staying in NICU  [facility], Ana Castonguay 10/20/2013, 8:47 AM

## 2013-10-20 NOTE — Lactation Note (Signed)
This note was copied from the chart of Ana May Leamer. Lactation Consultation Note     Follow up consult with this mom of a NICU baby, now 9065 hours old, and 31 2/7 weeks CGA. Mom is being discharged to home today. [facility], and I was able to give the phone to mom. [facility] - he ride to home is coming too late, but she will be able to get there tomorrow, for a DEP.   Patient Name: Ana May Panzer ZOXWR'UToday's Date: 10/20/2013 Reason for consult: Follow-up assessment   Maternal Data    Feeding Feeding Type: Formula Length of feed: 30 min  LATCH Score/Interventions                      Lactation Tools Discussed/Used     Consult Status Consult Status: PRN Follow-up type: In-patient (NICU)    Alfred LevinsLee, Tinya Cadogan Anne 10/20/2013, 3:41 PM

## 2013-10-20 NOTE — Lactation Note (Signed)
This note was copied from the chart of Ana Nardos Putnam. Lactation Consultation Note Mom has baby in NICU and needs a breast pump from [facility] and has been unable to reach anyone. I called Thayer Ohm in [facility] received a # for [employer] and left a message with her to call Ms. Wareing and called back and left # to call me. I also myself attempted to call [facility] and didn't get an answer. Pt. Is being d/c home today. Will cont. To follow up to ensure she has a pump before d/c home. Patient Name: Ana May Date: 10/20/2013     Maternal Data    Feeding    LATCH Score/Interventions                      Lactation Tools Discussed/Used     Consult Status      Ana May, Diamond Nickel 10/20/2013, 1:09 PM

## 2013-10-20 NOTE — Discharge Instructions (Signed)

## 2013-10-20 NOTE — Progress Notes (Signed)
CSW met with MOB as she had been tearful on 8/3.  MOB preparing for discharge, and acknowledges that she was tearful as she anticipated separated from her baby once she is discharge.  MOB became tearful again as she discussed their separation.  CSW validated and normalized her feelings. MOB discussed how she knows that her baby is in good care, and CSW assisted MOB to process her feelings when she is in the NICU visiting.  MOB shared that she is hopeful as he is making progress thus far and has enjoyed spending time with him.  MOB expressed challenges related to visiting him once she is discharged since she is unable to access the bus from her home and has been told that she cannot drive for 2 weeks.  MOB stated "I'll figure something out" since she does not want to be separated for him.  CSW emphasized the importance of self-care and engaging in positive activities when she returns home, and MOB expressed goal of reading and spending time with her 33 year-old.  CSW assisted MOB to verbalize how these daily acts of self-care may assist her to reduce stress during this time.

## 2013-10-22 ENCOUNTER — Ambulatory Visit: Payer: Self-pay

## 2013-10-22 NOTE — Lactation Note (Signed)
This note was copied from the chart of Ana May Heyden. Lactation Consultation Note  Follow up visit made at baby's bedside.  Mom states she is pumping every 3 hours for 30 minutes and obtaining 5 mls of transitional milk.  She states she has the pumped turned up to 8 and she also hand expresses after pumpings.  She is holding the pump by the bottles.  Recommended she hold the flanges to the breast for a better seal.  Encouraged her to pump every 2 hours during the day when possible.She is motivated to provide milk for her baby.  Mom is currently holding baby skin to skin .  Patient Name: Ana May Olarte ZOXWR'UToday's Date: 10/22/2013     Maternal Data    Feeding Feeding Type: Formula Length of feed: 60 min  LATCH Score/Interventions                      Lactation Tools Discussed/Used     Consult Status      Hansel Feinsteinowell, Cortez Flippen Ann 10/22/2013, 2:52 PM

## 2013-10-24 ENCOUNTER — Encounter (HOSPITAL_COMMUNITY): Payer: Self-pay | Admitting: *Deleted

## 2013-10-24 ENCOUNTER — Inpatient Hospital Stay (HOSPITAL_COMMUNITY)
Admission: AD | Admit: 2013-10-24 | Discharge: 2013-10-25 | Disposition: A | Discharge status: Home / Self Care | Payer: Medicaid Other | Source: Ambulatory Visit | Attending: Family Medicine | Admitting: Family Medicine

## 2013-10-24 DIAGNOSIS — Z87891 Personal history of nicotine dependence: Secondary | ICD-10-CM | POA: Diagnosis not present

## 2013-10-24 DIAGNOSIS — R51 Headache: Secondary | ICD-10-CM | POA: Diagnosis not present

## 2013-10-24 DIAGNOSIS — IMO0001 Reserved for inherently not codable concepts without codable children: Secondary | ICD-10-CM | POA: Diagnosis not present

## 2013-10-24 DIAGNOSIS — O165 Unspecified maternal hypertension, complicating the puerperium: Secondary | ICD-10-CM

## 2013-10-24 LAB — COMPREHENSIVE METABOLIC PANEL
ALT: 39 U/L — AB (ref 0–35)
AST: 19 U/L (ref 0–37)
Albumin: 2.8 g/dL — ABNORMAL LOW (ref 3.5–5.2)
Alkaline Phosphatase: 70 U/L (ref 39–117)
Anion gap: 11 (ref 5–15)
BUN: 10 mg/dL (ref 6–23)
CALCIUM: 8.5 mg/dL (ref 8.4–10.5)
CO2: 25 mEq/L (ref 19–32)
Chloride: 103 mEq/L (ref 96–112)
Creatinine, Ser: 0.87 mg/dL (ref 0.50–1.10)
GFR calc non Af Amer: 87 mL/min — ABNORMAL LOW (ref >90)
Glucose, Bld: 92 mg/dL (ref 70–99)
Potassium: 3.9 mEq/L (ref 3.7–5.3)
SODIUM: 139 meq/L (ref 137–147)
TOTAL PROTEIN: 6.5 g/dL (ref 6.0–8.3)
Total Bilirubin: 0.4 mg/dL (ref 0.3–1.2)

## 2013-10-24 LAB — URINALYSIS, ROUTINE W REFLEX MICROSCOPIC
Bilirubin Urine: NEGATIVE
Glucose, UA: NEGATIVE mg/dL
Ketones, ur: NEGATIVE mg/dL
NITRITE: NEGATIVE
Protein, ur: NEGATIVE mg/dL
Specific Gravity, Urine: <1.005 — ABNORMAL LOW (ref 1.005–1.030)
UROBILINOGEN UA: 1 mg/dL (ref 0.0–1.0)
pH: 6.5 (ref 5.0–8.0)

## 2013-10-24 LAB — CBC
HCT: 27 % — ABNORMAL LOW (ref 36.0–46.0)
Hemoglobin: 8.7 g/dL — ABNORMAL LOW (ref 12.0–15.0)
MCH: 24.2 pg — ABNORMAL LOW (ref 26.0–34.0)
MCHC: 32.2 g/dL (ref 30.0–36.0)
MCV: 75.2 fL — ABNORMAL LOW (ref 78.0–100.0)
Platelets: 294 10*3/uL (ref 150–400)
RBC: 3.59 MIL/uL — ABNORMAL LOW (ref 3.87–5.11)
RDW: 15.8 % — ABNORMAL HIGH (ref 11.5–15.5)
WBC: 10.3 10*3/uL (ref 4.0–10.5)

## 2013-10-24 LAB — URINE MICROSCOPIC-ADD ON

## 2013-10-24 MED ORDER — LACTATED RINGERS IV SOLN
INTRAVENOUS | Status: DC
  Administered 2013-10-24: 21:00:00 via INTRAVENOUS

## 2013-10-24 MED ORDER — ENALAPRIL MALEATE 5 MG PO TABS
5.0000 mg | ORAL_TABLET | Freq: Once | ORAL | Status: AC
  Administered 2013-10-24: 5 mg via ORAL
  Filled 2013-10-24: qty 1

## 2013-10-24 MED ORDER — CLONIDINE HCL 0.1 MG PO TABS
0.1000 mg | ORAL_TABLET | Freq: Once | ORAL | Status: AC
  Administered 2013-10-24: 0.1 mg via ORAL
  Filled 2013-10-24: qty 1

## 2013-10-24 MED ORDER — FUROSEMIDE 10 MG/ML IJ SOLN
40.0000 mg | Freq: Once | INTRAMUSCULAR | Status: AC
  Administered 2013-10-24: 40 mg via INTRAVENOUS
  Filled 2013-10-24: qty 4

## 2013-10-24 NOTE — MAU Note (Addendum)
Repeat C/S 8/1 for HTN. Here for B/P check. Having headaches last 4 days. Legs and feet very swollen. Denies visual changes or epigastric pain. Had baby shower today and just visited baby in NICU

## 2013-10-24 NOTE — MAU Provider Note (Signed)
History     CSN: [id number]  Arrival date and time: 10/24/13 [id number]   First Provider Initiated Contact with Patient 10/24/13 1944      Chief Complaint  Patient presents with  . Blood Pressure Check   HPI Benna DunksShenella Wynns is a 33 y.o. [id number] who is S/P [facility] 10/17/13 [facility] with superimposed pre-eclampsia. She was admitted here on 7/31 with uncontrolled hypertension. She underwent a c-section at 30.6 weeks. She was d/c home on 10/20/13 on labetalol 800 mg TID. She states that she has been taking that as prescribed. She took a dose this morning and again at 1600. She states that her next dose is due at 2300. She was here visiting the baby in NICU, and decided to come for evaluation. She states that she has been "very swollen". She was swollen around the time of delivery, but states that it has not improved. She also reports headache. She has not taken anything for the headache at this time. She denies visual disturbances or epigastric pain.   Past Medical History  Diagnosis Date  . Hypertension     Past Surgical History  Procedure Laterality Date  . Cesarean section    . Dilation and curettage of uterus    . Cesarean section N/A 10/17/2013    Procedure: CESAREAN SECTION, BILATERAL TUBAL LIGATION;  Surgeon: Tilda BurrowJohn Ferguson V, MD;  Location: [facility];  Service: Obstetrics;  Laterality: N/A;    History reviewed. No pertinent family history.  History  Substance Use Topics  . Smoking status: Former Smoker    Quit date: 02/16/2012  . Smokeless tobacco: Not on file  . Alcohol Use: Yes     Comment: none with pregnancy,but occasionally before pregnancy    Allergies: No Known Allergies  Prescriptions prior to admission  Medication Sig Dispense Refill  . clindamycin-benzoyl peroxide (BENZACLIN) gel Apply topically.      Marland Kitchen. ibuprofen (ADVIL,MOTRIN) 600 MG tablet Take 1 tablet (600 mg total) by mouth every 6 (six) hours.  30 tablet  0  . labetalol (NORMODYNE) 200 MG tablet Take 4 tablets  (800 mg total) by mouth 3 (three) times daily.  360 tablet  1  . oxyCODONE-acetaminophen (PERCOCET/ROXICET) 5-325 MG per tablet Take 1-2 tablets by mouth every 4 (four) hours as needed for severe pain (moderate - severe pain).  30 tablet  0  . Prenatal Vit-Fe Fumarate-FA (PRENATAL MULTIVITAMIN) TABS tablet Take 1 tablet by mouth daily at 12 noon.      . valACYclovir (VALTREX) 500 MG tablet Take by mouth.        ROS Physical Exam   Blood pressure 132/104, pulse 94, temperature 99 F (37.2 C), resp. rate 18, height 5' 5.5" (1.664 m), weight 125.102 kg (275 lb 12.8 oz), unknown if currently breastfeeding.  Physical Exam  Nursing note and vitals reviewed. Constitutional: She is oriented to person, place, and time. She appears well-developed and well-nourished.  Cardiovascular: Normal rate.   Respiratory: Effort normal.  GI: Soft. There is no tenderness.  Musculoskeletal: She exhibits edema (pitting edema from feet to umbilicus ).  Neurological: She is alert and oriented to person, place, and time. She has normal reflexes.  Psychiatric: She has a normal mood and affect.    MAU Course  Procedures Results for orders placed during the hospital encounter of 10/24/13 (from the past 24 hour(s))  CBC     Status: Abnormal   Collection Time    10/24/13  7:35 PM      Result  Value Ref Range   WBC 10.3  4.0 - 10.5 K/uL   RBC 3.59 (*) 3.87 - 5.11 MIL/uL   Hemoglobin 8.7 (*) 12.0 - 15.0 g/dL   HCT 29.5 (*) 62.1 - 30.8 %   MCV 75.2 (*) 78.0 - 100.0 fL   MCH 24.2 (*) 26.0 - 34.0 pg   MCHC 32.2  30.0 - 36.0 g/dL   RDW 65.7 (*) 84.6 - 96.2 %   Platelets 294  150 - 400 K/uL  COMPREHENSIVE METABOLIC PANEL     Status: Abnormal   Collection Time    10/24/13  7:35 PM      Result Value Ref Range   Sodium 139  137 - 147 mEq/L   Potassium 3.9  3.7 - 5.3 mEq/L   Chloride 103  96 - 112 mEq/L   CO2 25  19 - 32 mEq/L   Glucose, Bld 92  70 - 99 mg/dL   BUN 10  6 - 23 mg/dL   Creatinine, Ser 9.52  0.50 -  1.10 mg/dL   Calcium 8.5  8.4 - 84.1 mg/dL   Total Protein 6.5  6.0 - 8.3 g/dL   Albumin 2.8 (*) 3.5 - 5.2 g/dL   AST 19  0 - 37 U/L   ALT 39 (*) 0 - 35 U/L   Alkaline Phosphatase 70  39 - 117 U/L   Total Bilirubin 0.4  0.3 - 1.2 mg/dL   GFR calc non Af Amer 87 (*) >90 mL/min   GFR calc Af Amer >90  >90 mL/min   Anion gap 11  5 - 15     2000: D/W Dr. Shawnie Pons. Will give 40 mg of lasix and 5 mg vasotec PO x 1 now.  2022: Care turned over to [facility]. Mayford Knife, CNM  Tawnya Crook 10/24/2013, 7:52 PM   Assessment and Plan   BP did not come down much with above meds.  She did have good diuresis with Lasix, however. States her feet feel much better. Given two doses of Clonidine, with BPs finally improving.   Filed Vitals:   10/25/13 0058 10/25/13 0113 10/25/13 0128 10/25/13 0143  BP: 166/91 180/107 166/92 172/97  Pulse: 90 90 85 86  Temp:      Resp:      Height:      Weight:      SpO2:       Discussed with Dr Shawnie Pons. She recommends d/c home with addition of the following meds:  Vasotec 10mg  qd HCTZ 25mg  qd Continue Labetalol 800mg  TID  Patient is agreeable Has been on these before.   Will have her followup in clinic this week for a BP check around Wed.   Aviva Signs, CNM

## 2013-10-25 DIAGNOSIS — O165 Unspecified maternal hypertension, complicating the puerperium: Secondary | ICD-10-CM

## 2013-10-25 MED ORDER — ENALAPRIL MALEATE 10 MG PO TABS: 10.0000 mg | ORAL_TABLET | Freq: Every day | ORAL | Status: DC

## 2013-10-25 MED ORDER — LABETALOL HCL 100 MG PO TABS
800.0000 mg | ORAL_TABLET | Freq: Once | ORAL | Status: DC
  Administered 2013-10-25: 800 mg via ORAL

## 2013-10-25 MED ORDER — HYDROCHLOROTHIAZIDE 25 MG PO TABS: 25.0000 mg | ORAL_TABLET | Freq: Every day | ORAL | Status: DC

## 2013-10-25 MED ORDER — LABETALOL HCL 100 MG PO TABS
200.0000 mg | ORAL_TABLET | Freq: Once | ORAL | Status: DC
  Filled 2013-10-25 (×2): qty 2

## 2013-10-25 MED ORDER — DSS 100 MG PO CAPS: 100.0000 mg | ORAL_CAPSULE | Freq: Once | ORAL | Status: DC

## 2013-10-25 MED ORDER — DOCUSATE SODIUM 100 MG PO CAPS
100.0000 mg | ORAL_CAPSULE | Freq: Once | ORAL | Status: AC
  Administered 2013-10-25: 100 mg via ORAL
  Filled 2013-10-25: qty 1

## 2013-10-25 MED ORDER — LABETALOL HCL 300 MG PO TABS
600.0000 mg | ORAL_TABLET | Freq: Once | ORAL | Status: DC
  Filled 2013-10-25: qty 2

## 2013-10-25 MED ORDER — CLONIDINE HCL 0.1 MG PO TABS
0.1000 mg | ORAL_TABLET | Freq: Once | ORAL | Status: AC
  Administered 2013-10-25: 0.1 mg via ORAL
  Filled 2013-10-25: qty 1

## 2013-10-25 NOTE — MAU Provider Note (Signed)
Attestation of Attending Supervision of Advanced Practitioner (PA/CNM/NP): Evaluation and management procedures were performed by the Advanced Practitioner under my supervision and collaboration.  I have reviewed the Advanced Practitioner's note and chart, and I agree with the management and plan.  Mirinda Monte S, MD [facility] Attending 10/25/2013 7:15 AM   

## 2013-10-25 NOTE — Discharge Instructions (Signed)

## 2013-11-05 ENCOUNTER — Encounter: Payer: Self-pay | Admitting: Obstetrics & Gynecology

## 2013-11-05 ENCOUNTER — Ambulatory Visit (INDEPENDENT_AMBULATORY_CARE_PROVIDER_SITE_OTHER): Payer: Medicaid Other | Admitting: Obstetrics & Gynecology

## 2013-11-05 VITALS — BP 121/91 | HR 85 | Ht 66.0 in | Wt 232.2 lb

## 2013-11-05 DIAGNOSIS — Z09 Encounter for follow-up examination after completed treatment for conditions other than malignant neoplasm: Secondary | ICD-10-CM

## 2013-11-05 DIAGNOSIS — O1003 Pre-existing essential hypertension complicating the puerperium: Secondary | ICD-10-CM

## 2013-11-05 MED ORDER — HYDROCHLOROTHIAZIDE 25 MG PO TABS: 25.0000 mg | ORAL_TABLET | Freq: Every day | ORAL | Status: DC

## 2013-11-05 MED ORDER — ENALAPRIL MALEATE 10 MG PO TABS: 10.0000 mg | ORAL_TABLET | Freq: Every day | ORAL | Status: DC

## 2013-11-05 NOTE — Progress Notes (Signed)
   Subjective:    Patient ID: Benna DunksShenella Mcmichael, female    DOB: August 26, 1980, 33 y.o.   MRN: [id number]  HPI This morbidly obese AA P2 is here at 3 weeks pp/po to have a BP check. She had a C/S and BTL at 30 weeks for severe range BPs and pre eclampsia. She was sent home on vasotec 10 mg daily (but she decided to take 20 mg daily) and labetolol 800 mg TID and HCTZ 25 mg daily (She decided to take 50 mg daily). She reports that her incision is healing well. She denies any complaints today.   Review of Systems     Objective:   Physical Exam  Abd- obese, benign Incision- healing well LEs- no edema      Assessment & Plan:  3 weeks pp/po -doing well I have encouraged her to only take 10 mg of the vasotec and let us check her BP next week.

## 2013-11-05 NOTE — Progress Notes (Signed)
Patient is here today for BP check and incision check.  Incision is clean and dry and healing well.  BP  Is doing much better, patient states it had been running 190/112 at the end of her pregnancy.

## 2013-11-06 ENCOUNTER — Ambulatory Visit: Payer: Medicaid Other

## 2013-11-12 ENCOUNTER — Encounter: Payer: Self-pay | Admitting: *Deleted

## 2013-11-12 ENCOUNTER — Ambulatory Visit (INDEPENDENT_AMBULATORY_CARE_PROVIDER_SITE_OTHER): Payer: Medicaid Other | Admitting: Obstetrics and Gynecology

## 2013-11-12 VITALS — BP 166/119 | HR 80 | Ht 66.0 in | Wt 235.0 lb

## 2013-11-12 DIAGNOSIS — O1003 Pre-existing essential hypertension complicating the puerperium: Secondary | ICD-10-CM

## 2013-11-12 DIAGNOSIS — Z0130 Encounter for examination of blood pressure without abnormal findings: Secondary | ICD-10-CM

## 2013-11-12 MED ORDER — HYDROCHLOROTHIAZIDE 25 MG PO TABS: 25.0000 mg | ORAL_TABLET | Freq: Every day | ORAL | Status: DC

## 2013-11-12 MED ORDER — ENALAPRIL MALEATE 10 MG PO TABS: 10.0000 mg | ORAL_TABLET | Freq: Every day | ORAL | Status: DC

## 2013-11-12 NOTE — Progress Notes (Signed)
Patient is here today for BP check and incision check.  She has been taking her BP medication but just ran out of hctz today so this is the only one today that she has not taken.  She would like her incision checked as she is having some bleeding drainage after washing the other day.  Small amount of drainage, incision looks clean. No indication of redness or increased inflammation.  Advised patient to continue cleaning area and utilize a pad to help absorb excess drainage.

## 2013-11-12 NOTE — Progress Notes (Signed)
Patient ID: Ana May, female   DOB: 1980-04-25, 33 y.o.   MRN: [id number] 34 yo [id number] s/p c-section on 8/2 here for BP check. PNC complicated by [facility]. Patient also reports drainage from incision, particularly after taking a shower. Patient is otherwise doing well and denies CP, SOB, headaches, visual disturbances, RUQ/epigastric pain  GENERAL: Well-developed, well-nourished female in no acute distress. Obese ABDOMEN: Soft, nontender, nondistended.  Incison: healing well, area of skin not completely re approximately  PELVIC: Normal external female genitalia. Vagina is pink and rugated.  Normal discharge. Normal appearing cervix. Uterus is normal in size. No adnexal mass or tenderness. EXTREMITIES: No cyanosis, clubbing, or edema, 2+ distal pulses.  A/P 33 yo s/p c-section on multiple BP meds - Incision healing well - BP poorly controlled on 3 agents. Patient admits to increasing the dose of her meds at time. Will refer to [place] for assistance in BP control - RTC in 2 weeks for BP check and pp visit

## 2013-11-25 ENCOUNTER — Ambulatory Visit: Payer: Medicaid Other | Admitting: Obstetrics and Gynecology

## 2013-11-30 ENCOUNTER — Ambulatory Visit: Payer: Medicaid Other | Admitting: Nurse Practitioner

## 2013-12-02 ENCOUNTER — Ambulatory Visit: Payer: Medicaid Other | Admitting: Advanced Practice Midwife

## 2014-01-18 ENCOUNTER — Encounter: Payer: Self-pay | Admitting: *Deleted

## 2014-02-22 ENCOUNTER — Encounter (HOSPITAL_COMMUNITY): Payer: Self-pay | Admitting: Obstetrics and Gynecology

## 2014-07-10 NOTE — Consult Note (Signed)
Referral Information:  Reason for Referral Blood pressure check   Referring Physician [facility]   Prenatal Hx Milus MallickShenella is a 34 year-old G4 P0131 at 5214 1/7 weeks  (EDC 12/20/13)  who returns for blood pressure check. She was seen for MFM consultation and first trimester screen on 06/18/13 [facility]. See full consult by Dr. Dolphus JennySmall dated 06/18/13.  Currently, see is taking Nifedipine XL 30 mg once daily and Labetalol 300 mg every 12 hours. She has no complaints. She denies headache or blurry vision.    She has a blood pressure cuff at home but has not been checking/recording her values.   Home Medications: Medication Instructions Status  acetaminophen 325 mg oral tablet 1 tab(s) orally every 4 to 6 hours, As needed, pain or fever Active  multivitamin, prenatal 1 tab(s) orally once a day Active  Procardia XL 30 mg oral tablet, extended release 1 tab(s) orally once a day in am Active  labetalol 300 milligram(s) orally 2 times a day  8am and 8pm Active   Allergies:   No Known Allergies:   Vital Signs/Notes:  Nursing Vital Signs: **Vital Signs.:   06-Apr-15 10:53  Pulse Pulse 95  Systolic BP Systolic BP 159  Diastolic BP (mmHg) Diastolic BP (mmHg) 96    Additional Lab/Radiology Notes First trimester screen from 06/18/13 is pending. 24 hour urine (06/10/13): 139 mg See consult note from 06/18/13 for other labs.   Impression/Recommendations:  Impression 34 year-old G4 P0131 at 1814 1/7 weeks with chronic HTN.  [facility] has asked us to assume her care. [facility].   Recommendations -Continue Nifedipine Xl 30 mg daily -Increase Labetalol to 400 mg every 12 hours -I have asked her to check her BP at home daily, record the values and bring these with her to her appointment on April 13th -I have also asked her to call the [facility] Attending on call at  [phone] with diastolic values over 100.  The phone number was provided -A script for labetalol (100 mg tabs, 4 tabs every 12 hours, #240) was provided    Total Time Spent with Patient 15 minutes   Office Use Only [address]9213  Office Visit Level 3 (15min) EST exp prob focused outpt   Electronic Signatures: Zeya Balles, [place] (MD)  (Signed 06-Apr-15 12:50)  Authored: Referral, Home Medications, Allergies, Vital Signs/Notes, Lab/Radiology Notes, Impression, Billing   Last Updated: 06-Apr-15 12:50 by Christian Borgerding, [place] (MD)

## 2014-07-10 NOTE — Consult Note (Signed)
Referral Information:  Reason for Referral Here for BP check   Referring Physician [facility]   Home Medications: Medication Instructions Status  acetaminophen 325 mg oral tablet 1 tab(s) orally every 4 to 6 hours, As needed, pain or fever Active  multivitamin, prenatal 1 tab(s) orally once a day Active  Aspir 81 81 mg oral delayed release tablet 1 tab(s) orally once a day Active  labetalol 600 milligram(s) orally 3 times a day Active  Procardia XL 30 mg oral tablet, extended release 2 tab(s) orally once a day Active   Allergies:   No Known Allergies:   Vital Signs/Notes:  Nursing Vital Signs: **Vital Signs.:   16-Jul-15 10:15  Vital Signs Type Routine  Temperature Temperature (F) 97.3  Celsius 36.2  Temperature Source oral  Pulse Pulse 97  Respirations Respirations 18  Systolic BP Systolic BP 146  Diastolic BP (mmHg) Diastolic BP (mmHg) 97  Pulse Ox % Pulse Ox % 100  Pulse Ox Activity Level  At rest  Oxygen Delivery Room Air/ 21 %    Additional Lab/Radiology Notes 09/28/2013 - glucose screen 155   Impression/Recommendations:  Impression BP stable on current regimen   Recommendations 3 hr [facility] Keep appt with nephrology 10/07/2013 RTC [facility] in 2 weeks    Total Time Spent with Patient 15 minutes   >50% of visit spent in couseling/coordination of care yes   Coding Description: MATERNAL CONDITIONS/HISTORY INDICATION(S).   HTN - Chronic.  Electronic Signatures: Lady DeutscherJames, Arlisha Patalano (MD)  (Signed 16-Jul-15 15:12)  Authored: Referral, Home Medications, Allergies, Vital Signs/Notes, Lab/Radiology Notes, Impression, Billing, Coding Description   Last Updated: 16-Jul-15 15:12 by Lady DeutscherJames, Chidera Thivierge (MD)

## 2014-07-10 NOTE — Consult Note (Signed)
Referral Information:  Reason for Referral 34 yo [id number] wtih EDD 12/20/13, at 13/4 weeks by ultrasound performed at [facility] on 05/18/13; measurements were consistent with 9w 1 day.  She is referred due to difficult to control chronic hypertension. She was diagnosed with CHTN 12 years ago (following last pregnancy) and was previously on HCTZ/lisinopril.  During current pregnancy, she was poorly controlled on Labetalol 871m bid [facility]. She was discharged yesterday pm on Labetalol 4086mbid and Nifedipine 30 mg XL daily.   Referring Physician [facility]   Past Obstetrical Hx 2003, 36 weeks,  female, 2lb 12 oz, diagnosed with preeclampsia, cesarean section history of first tri SAB TAb x 2   Home Medications: Medication Instructions Status  acetaminophen 325 mg oral tablet 1 tab(s) orally every 4 to 6 hours, As needed, pain or fever Active  multivitamin, prenatal 1 tab(s) orally once a day Active  Procardia XL 30 mg oral tablet, extended release 1 tab(s) orally once a day in am Active  labetalol 300 milligram(s) orally 2 times a day  8am and 8pm Active   Allergies:   No Known Allergies:   Vital Signs/Notes:  Nursing Vital Signs: **Vital Signs.:   02-Apr-15 12:56  Vital Signs Type Routine  Temperature Temperature (F) 98.3  Celsius 36.8  Temperature Source oral  Pulse Pulse 99  Respirations Respirations 20  Systolic BP Systolic BP 19096Diastolic BP (mmHg) Diastolic BP (mmHg) 92  Mean BP 125  Pulse Ox % Pulse Ox % 98  Pulse Ox Activity Level  At rest  Oxygen Delivery Room Air/ 21 %    13:16  Pulse Pulse 94  Pulse source if not from Vital Sign Device dinamap  Respirations Respirations 16  Systolic BP Systolic BP 15283Diastolic BP (mmHg) Diastolic BP (mmHg) 72  Mean BP 98  BP Source  if not from Vital Sign Device non-invasive   Perinatal Consult:  Past Medical History cont'd chronic hypertension--diagnosed following  first pregnancy (had preeclampsia and bp elevations persisted postpartum) no history of renal involvement, has not had renal ultrasound   PSurg Hx d&c x 3, cesarean section   FHx mother died secondar to sarcoid; father died fom non etoh associated cirrhosis   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  We addressed pregnancy related complications associated with hypertension including fetal growth restriction, superimposed preeclampsia, and abruption.  She was familiar with preeclampsia as it affected her first pregnancy.  We addressed pregnancy surveillance (below) as well as the used of low dose aspirin for preeclampsia risk reduction.  2. elevated bmi--we did not discuss IOM gestational weight gain recommendations(<15 lbs). She may benefit from consultation with a nutritionist. 3. Aneuploidy screening--the patient reported desire for first trimester screening. Given the gestational age limitation, performed this testing today and provided genetic counseling.  Please see the ultrasound report for details.   3. Low vitmin D levels 02/2013--noted following further review of patient records.  Recommendations 1. She recently had Nifedipine added to her regimen and was discharged yesterday evening from [facility], so I was reluctant to make changes in her medications today. She will record her blood pressure daily and return to our office in 4 days  for review. If her bp is still elevated we will likely increase her Nifedipine dosage and plan to maximize that medication if the blood pressure remains difficult to control. --recommend baseline ECG. If abnormal/LVH, recommend maternal echocardiogram 2. Repeat vitamin D levels--if remain low, can supplement with 1000 international unit Vitamin D3 or D2 daily   Plan:  Genetic Counseling yes, met with counselor during first tri screening visit   Prenatal Diagnosis Options First trimester, performed today   Ultrasound at what gestational ages Monthly >24 weeks   Antepartum Testing Weekly, Starting at 49 to 36 weeks   Additional Testing Folate/prenatal vitamins    Total Time Spent with Patient 30 minutes   >50% of visit spent in couseling/coordination of care yes   Office Use Only 99242  Level 2 (67mn) NEW office consult exp prob focused   Coding Description: MATERNAL CONDITIONS/HISTORY INDICATION(S).   Chronic HTN.   Obesity - BMI greater than equal to 30.  Electronic Signatures: SManfred Shirts(MD)  (Signed 02-Apr-15 15:36)  Authored: Referral, Home Medications, Allergies, Vital Signs/Notes, Consult, Lab/Radiology Notes, Impression, Plan, Billing, Coding Description   Last Updated: 02-Apr-15 15:36 by SManfred Shirts(MD)

## 2014-07-27 NOTE — H&P (Signed)
L&D Evaluation:  History Expanded:  HPI 34 yo G4P0121 at 5w2dgestational age by 12 week ultrasound whose pregnancy has been complicated by severe, uncontrolled hypertension, morbid obesity, history of prior cesarean, section, and genital herpes presents to the ER today after being seen in clinic.  In clinic her blood pressures have been elevated to 170s/110s. She has been treated with labetalol 402mpo bid.  She was sent to the ER on 3/16 for the same.  She states that she usually takes lisinopril/hctz outside of pregnancy, but is on and off of the medication.  She was not taking the medication at the time she conceived. She has also been reporting right arm numbness and tingling in clinic, though she denies this issue currently. She had problems with her blood pressure with her prior pregnancy which [facility] weeks [facility] Currently she denies headaches, visual changes, numbness, weakness, and tingling in all extremities.  She denies chest pain and difficulty breathing.  She states that she is compliant on her medications.  She had a baseline 24 hour urine total protein this pregnancy already which returned as 13542m  Patient's Medical History 1) Anxiety, 2) Chronic hypertension, 3) depression   Patient's Surgical History 1) cesarean delivery, 2) dilation and curettage   Medications Pre Natal Vitamins  labetalol 400m76m bid   Allergies NKDA   Social History denies EtOH, drug, and tobacco use   Family History type 2 DM, hypertension   ROS:  ROS All systems were reviewed.  HEENT, CNS, GI, GU, Respiratory, CV, Renal and Musculoskeletal systems were found to be normal., unless noted in HPI   Exam:  Vital Signs T 98.2, P 82-97, BP 170-210/88-113, RR18-20, O2 sat 100%RA, pain 0/10   General Obese, cooperative, in NAD   Mental Status clear   Chest clear   Heart normal sinus rhythm   Abdomen soft, nontender, nondistended   Back no  CVAT   Edema 1+  Pitting  bilateral lower extremities   Skin no lesions   Impression:  Impression 1) Intrauterine pregnancy at 13w282w2dational age, 2) chronic, severe hypertension   Plan:  Comments 1) admit for blood pressure management. 2) chronic hypertension:       a) Will switch the patient to nifedipine as she has not had a signficant response to labetalol.  She is not quite close to the make dose of labetalol of 2,400mg 22m.  If she does not respond,       b) may need to start another agent, like amlodipine.        c) May consult [facility] on Thursday, if patient is still in house.        d) Will send urine drug screen.      e) target BPs 120-160/80-105 per ACOG task force recommendations from Nov 2013.      f) If patient still refractory tomorrow, will consider gen med consult. 3) SCDs for prophylaxis 4) regular diet   Labs:  Lab Results: Routine Chem:  31-Mar-15 18:06   Glucose, Serum 96  BUN  6  Creatinine (comp) 0.74  Sodium, Serum 136  Potassium, Serum 4.0  Chloride, Serum 105  CO2, Serum 26  Calcium (Total), Serum 9.0  Anion Gap  5  Osmolality (calc) 269  eGFR (African American) >60  eGFR (Non-African American) >60 (eGFR values <60mL/m70m.73 m2 may be an indication of chronic kidney disease (CKD). Calculated eGFR is useful in patients with  stable renal function. The eGFR calculation will not be reliable in acutely ill patients when serum creatinine is changing rapidly. It is not useful in  patients on dialysis. The eGFR calculation may not be applicable to patients at the low and high extremes of body sizes, pregnant women, and vegetarians.)  Magnesium, Serum  1.3 (1.8-2.4 THERAPEUTIC RANGE: 4-7 mg/dL TOXIC: > 10 mg/dL  -----------------------)  Routine UA:  31-Mar-15 18:06   Color (UA) Yellow  Clarity (UA) Hazy  Glucose (UA) Negative  Bilirubin (UA) Negative  Ketones (UA) Negative  Specific Gravity (UA) 1.016  Blood (UA) Negative  pH (UA) 7.0   Protein (UA) Negative  Nitrite (UA) Negative  Leukocyte Esterase (UA) Negative (Result(s) reported on 16 Jun 2013 at 06:40PM.)  RBC (UA) NONE SEEN  WBC (UA) NONE SEEN  Bacteria (UA) NONE SEEN  Epithelial Cells (UA) 1 /HPF  Amorphous Crystal (UA) PRESENT (Result(s) reported on 16 Jun 2013 at 06:40PM.)  Routine Hem:  31-Mar-15 18:06   WBC (CBC)  11.9  RBC (CBC) 5.06  Hemoglobin (CBC)  11.2  Hematocrit (CBC) 36.3  Platelet Count (CBC) 266  MCV  72  MCH  22.1  MCHC  30.8  RDW  19.5  Neutrophil % 71.4  Lymphocyte % 19.6  Monocyte % 6.7  Eosinophil % 2.0  Basophil % 0.3  Neutrophil #  8.5  Lymphocyte # 2.3  Monocyte # 0.8  Eosinophil # 0.2  Basophil # 0.0 (Result(s) reported on 16 Jun 2013 at 06:40PM.)   Electronic Signatures: Will Bonnet (MD)  (Signed 01-Apr-15 00:40)  Authored: L&D Evaluation, Labs   Last Updated: 01-Apr-15 00:40 by Will Bonnet (MD)

## 2015-05-24 ENCOUNTER — Emergency Department (HOSPITAL_COMMUNITY)
Admission: EM | Admit: 2015-05-24 | Discharge: 2015-05-24 | Discharge status: Against Medical Advice | Payer: Medicaid Other | Source: Home / Self Care

## 2015-07-12 IMAGING — CR DG CERVICAL SPINE COMPLETE 4+V
7 series · 7 of 7 positions shown · non-contrast
Comparison: None.

CLINICAL DATA: Pain post trauma

EXAM:
CERVICAL SPINE  4+ VIEWS

[w c-spine a.p.]
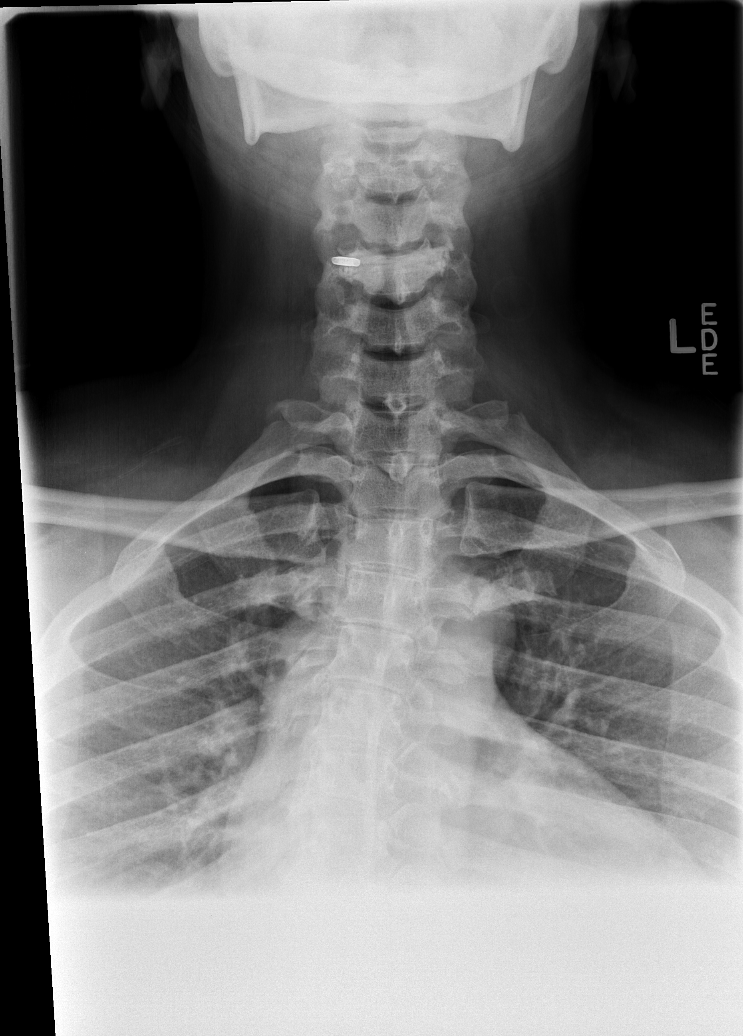

[w c-spine odontoid]
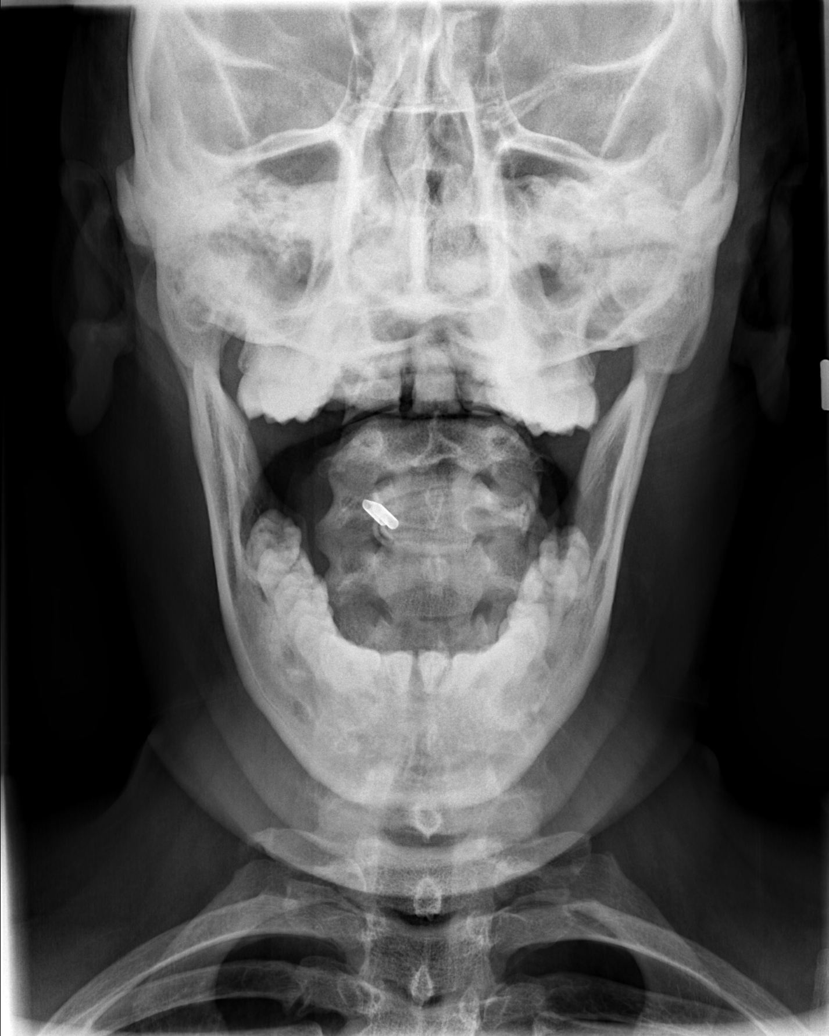

[w swimmers view (1 of 2)]
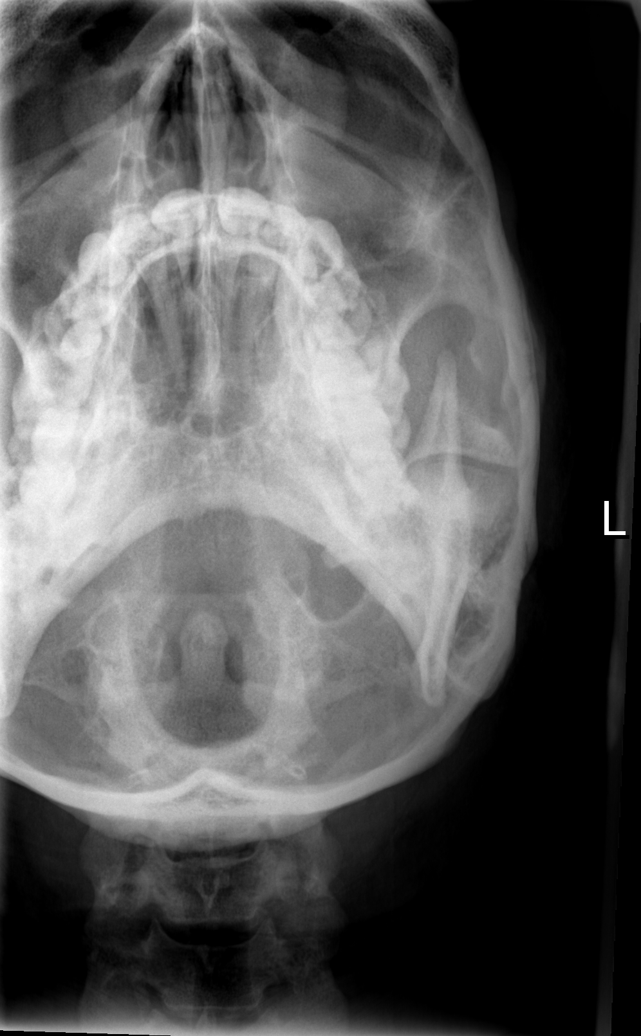

[w swimmers view (2 of 2)]
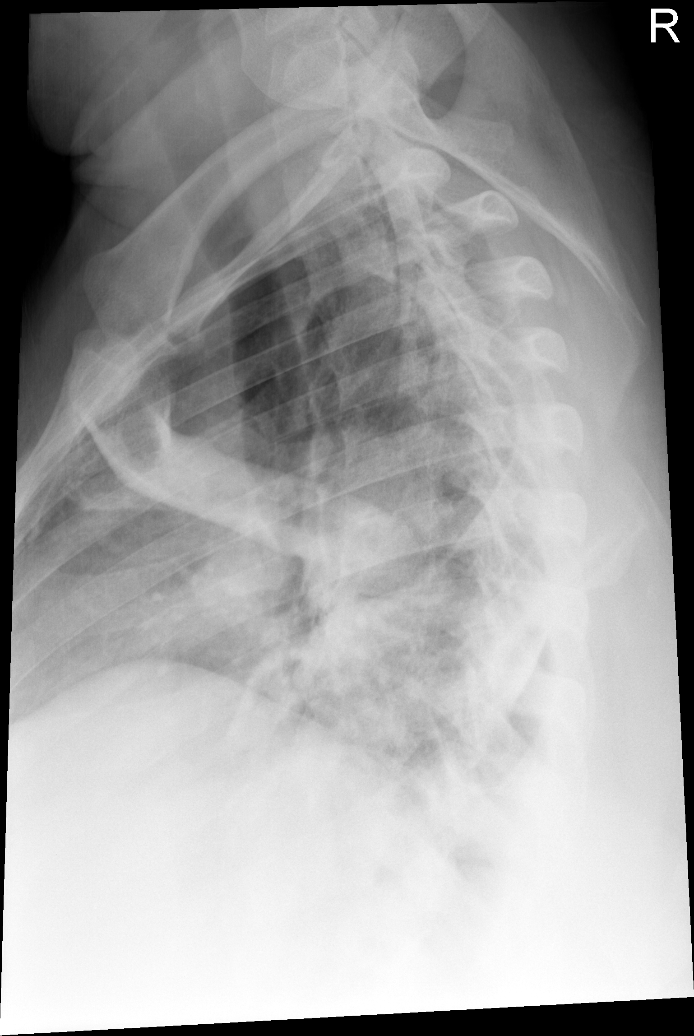

[w c-spine lat]
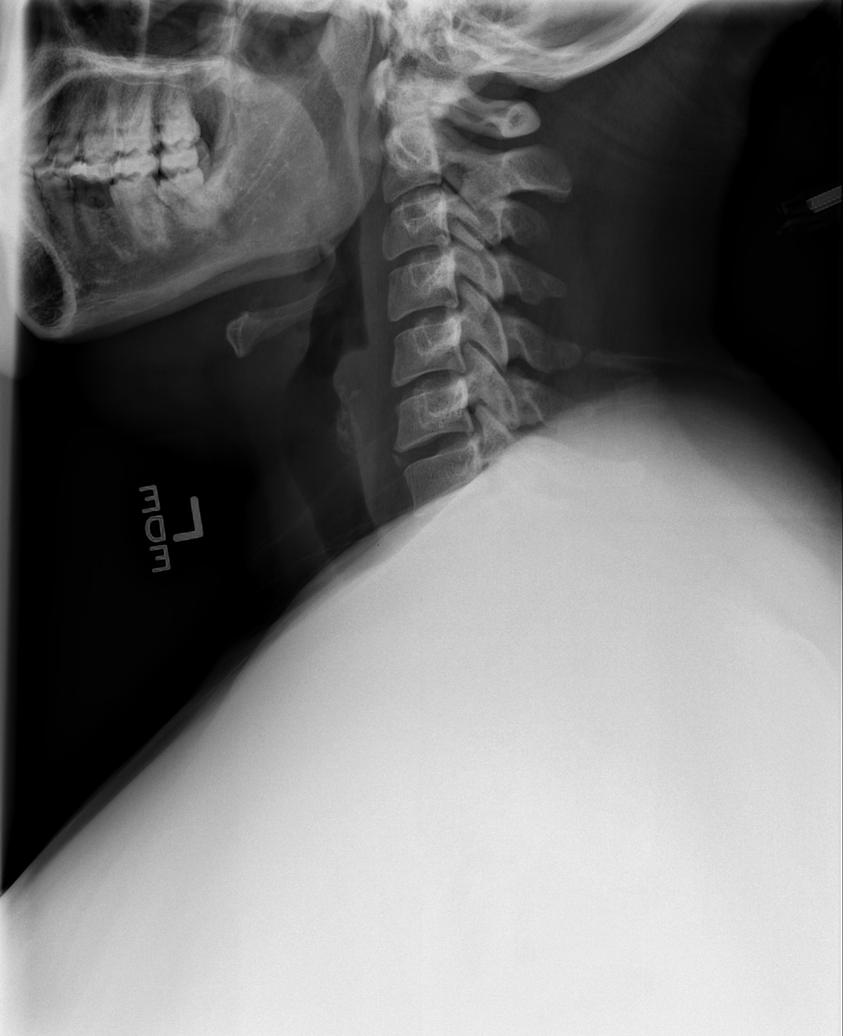

[w c-spine oblique (1 of 2)]
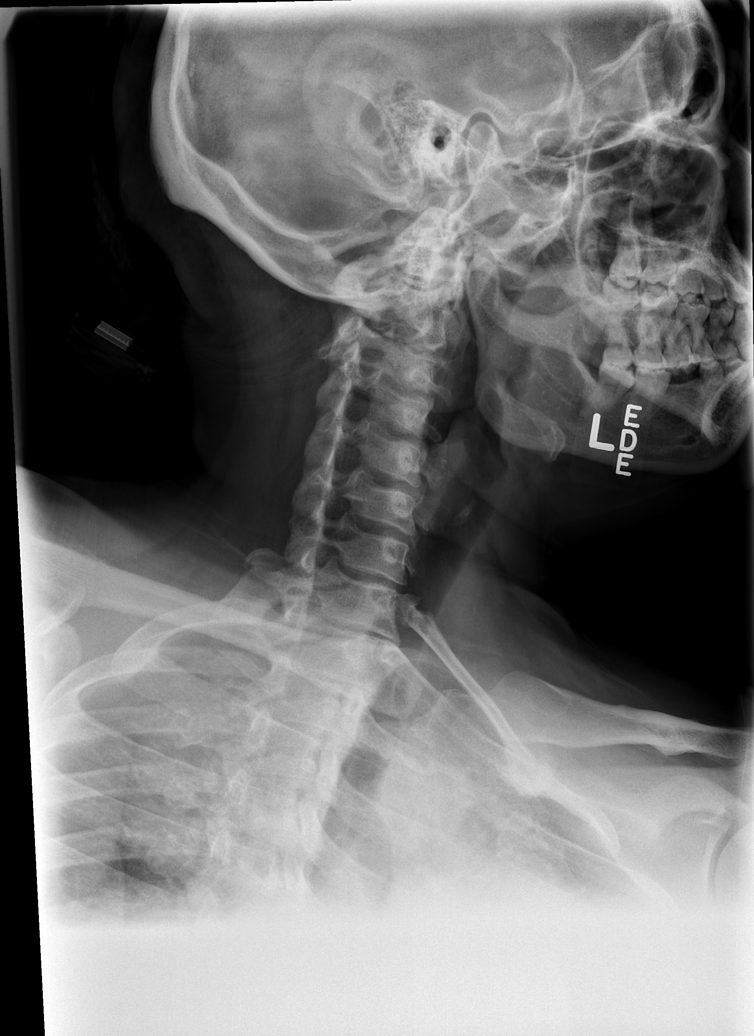

[w c-spine oblique (2 of 2)]
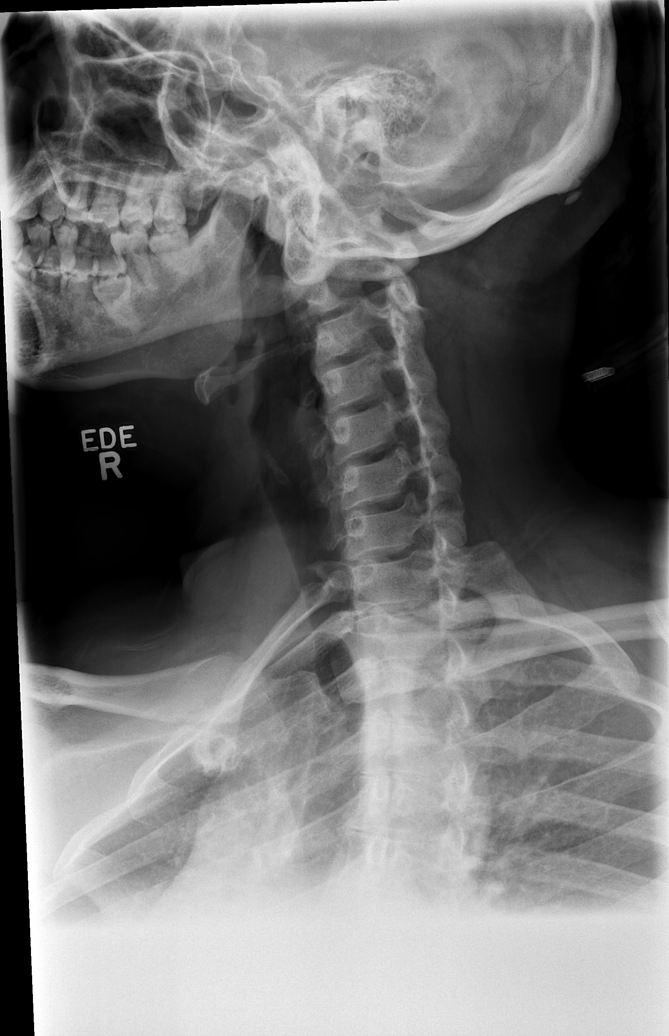

[7 of 7 positions shown; findings below may reference images not displayed]

FINDINGS: Frontal, lateral, open-mouth odontoid, and bilateral oblique views
were obtained. There is no fracture or spondylolisthesis.
Prevertebral soft tissues and predental space regions are normal.

There is slight disk space narrowing at C5-6. Other disc spaces
appear normal. There is calcification in the anterior ligament at
C5-6. There is no appreciable exit foraminal narrowing on the
oblique views. There is upper thoracic dextroscoliosis.
IMPRESSION: Slight osteoarthritic change. Upper thoracic dextroscoliosis. No
fracture or spondylolisthesis.

## 2015-08-21 ENCOUNTER — Ambulatory Visit (HOSPITAL_COMMUNITY)
Admission: EM | Admit: 2015-08-21 | Discharge: 2015-08-21 | Disposition: A | Discharge status: Home / Self Care | Payer: Medicaid Other | Attending: Family Medicine | Admitting: Family Medicine

## 2015-08-21 ENCOUNTER — Encounter (HOSPITAL_COMMUNITY): Payer: Self-pay | Admitting: Emergency Medicine

## 2015-08-21 DIAGNOSIS — L732 Hidradenitis suppurativa: Secondary | ICD-10-CM | POA: Diagnosis not present

## 2015-08-21 DIAGNOSIS — N611 Abscess of the breast and nipple: Secondary | ICD-10-CM

## 2015-08-21 DIAGNOSIS — I10 Essential (primary) hypertension: Secondary | ICD-10-CM

## 2015-08-21 DIAGNOSIS — R590 Localized enlarged lymph nodes: Secondary | ICD-10-CM

## 2015-08-21 MED ORDER — DOXYCYCLINE HYCLATE 100 MG PO CAPS: 100.0000 mg | ORAL_CAPSULE | Freq: Two times a day (BID) | ORAL | Status: DC

## 2015-08-21 NOTE — ED Notes (Signed)
[facility] a [facility] of recurring boils under her right arm and right breast that have been there for several months off and on.

## 2015-08-21 NOTE — Discharge Instructions (Signed)
It was nice seeing you today. I have given you antibiotic for your boil. Please see your PCP soon for breast ultrasound to ensure there is nothing else we are missing since this has been ongoing for more than 6 months. Follow up with us as needed.   Hidradenitis Suppurativa Hidradenitis suppurativa is a long-term (chronic) skin disease that starts with blocked sweat glands or hair follicles. Bacteria may grow in these blocked openings of your skin. Hidradenitis suppurativa is like a severe form of acne that develops in areas of your body where acne would be unusual. It is most likely to affect the areas of your body where skin rubs against skin and becomes moist. This includes your:  Underarms.  Groin.  Genital areas.  Buttocks.  Upper thighs.  Breasts. Hidradenitis suppurativa may start out with small pimples. The pimples can develop into deep sores that break open (rupture) and drain pus. Over time your skin may thicken and become scarred. Hidradenitis suppurativa cannot be passed from person to person.  CAUSES  The exact cause of hidradenitis suppurativa is not known. This condition may be due to:  Female and female hormones. The condition is rare before and after puberty.  An overactive body defense system (immune system). Your immune system may overreact to the blocked hair follicles or sweat glands and cause swelling and pus-filled sores. RISK FACTORS You may have a higher risk of hidradenitis suppurativa if you:  Are a woman.  Are between ages 5811 and 6255.  Have a family history of hidradenitis suppurativa.  Have a personal history of acne.  Are overweight.  Smoke.  Take the drug lithium. SIGNS AND SYMPTOMS  The first signs of an outbreak are usually painful skin bumps that look like pimples. As the condition progresses:  Skin bumps may get bigger and grow deeper into the skin.  Bumps under the skin may rupture and drain smelly pus.  Skin may become itchy and  infected.  Skin may thicken and scar.  Drainage may continue through tunnels under the skin (fistulas).  Walking and moving your arms can become painful. DIAGNOSIS  Your health care provider may diagnose hidradenitis suppurativa based on your medical history and your signs and symptoms. A physical exam will also be done. You may need to see a health care provider who specializes in skin diseases (dermatologist). You may also have tests done to confirm the diagnosis. These can include:  Swabbing a sample of pus or drainage from your skin so it can be sent to the lab and tested for infection.  Blood tests to check for infection. TREATMENT  The same treatment will not work for everybody with hidradenitis suppurativa. Your treatment will depend on how severe your symptoms are. You may need to try several treatments to find what works best for you. Part of your treatment may include cleaning and bandaging (dressing) your wounds. You may also have to take medicines, such as the following:  Antibiotics.  Acne medicines.  Medicines to block or suppress the immune system.  A diabetes medicine (metformin) is sometimes used to treat this condition.  For women, birth control pills can sometimes help relieve symptoms. You may need surgery if you have a severe case of hidradenitis suppurativa that does not respond to medicine. Surgery may involve:   Using a laser to clear the skin and remove hair follicles.  Opening and draining deep sores.  Removing the areas of skin that are diseased and scarred. HOME CARE INSTRUCTIONS  Learn  as much as you can about your disease, and work closely with your health care providers.  Take medicines only as directed by your health care provider.  If you were prescribed an antibiotic medicine, finish it all even if you start to feel better.  If you are overweight, losing weight may be very helpful. Try to reach and maintain a healthy weight.  Do not use any  tobacco products, including cigarettes, chewing tobacco, or electronic cigarettes. If you need help quitting, ask your health care provider.  Do not shave the areas where you get hidradenitis suppurativa.  Do not wear deodorant.  Wear loose-fitting clothes.  Try not to overheat and get sweaty.  Take a daily bleach bath as directed by your health care provider.  Fill your bathtub halfway with water.  Pour in  cup of unscented household bleach.  Soak for 5-10 minutes.  Cover sore areas with a warm, clean washcloth (compress) for 5-10 minutes. SEEK MEDICAL CARE IF:   You have a flare-up of hidradenitis suppurativa.  You have chills or a fever.  You are having trouble controlling your symptoms at home.   This information is not intended to replace advice given to you by your health care provider. Make sure you discuss any questions you have with your health care provider.   Document Released: 10/18/2003 Document Revised: 03/26/2014 Document Reviewed: 06/05/2013 Elsevier Interactive Patient Education [employer]

## 2015-08-21 NOTE — ED Provider Notes (Addendum)
CSN: [id number]     Arrival date & time 08/21/15  1459 History   First MD Initiated Contact with Patient 08/21/15 1637     Chief Complaint  Patient presents with  . Abscess   (Consider location/radiation/quality/duration/timing/severity/associated sxs/prior Treatment) The history is provided by the patient. No language interpreter was used.  Boil: C/O recurrent boil on her right armpit and breast on going for more than 6 months on and off. It hurts on her arm and breast with pressure and movement. About 6/0 in severity. Denies pus discharge. Swelling is red. Treatment tried in the past include OTC boil cream help some. At times she uses pin to pop it. No fever. Submandibular LN: She was told 1 wk ago she had a swollen lymph node under her chin. She feels it has gone now but will like to get it checked. Chest pain: C/O right sided chest pain on and off last episode was yesterday. Feels like soreness whenever she presses on it. 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 HTN: She took her meds this morning. Denies any concern.  Past Medical History  Diagnosis Date  . Hypertension    Past Surgical History  Procedure Laterality Date  . Cesarean section    . Dilation and curettage of uterus    . Cesarean section N/A 10/17/2013    Procedure: CESAREAN SECTION, BILATERAL TUBAL LIGATION;  Surgeon: Tilda BurrowJohn Ferguson V, MD;  Location: [facility];  Service: Obstetrics;  Laterality: N/A;   History reviewed. No pertinent family history. Social History  Substance Use Topics  . Smoking status: Current Some Day Smoker    Last Attempt to Quit: 02/16/2012  . Smokeless tobacco: Never Used  . Alcohol Use: Yes     Comment: none with pregnancy,but occasionally before pregnancy   OB History    Gravida Para Term Preterm AB TAB SAB Ectopic Multiple Living   3 2  2 1  1   2      Review of Systems  Constitutional: Negative for fever.  Respiratory: Negative.   Cardiovascular: Negative.     Gastrointestinal: Negative.   Skin:       boil  All other systems reviewed and are negative.   Allergies  Review of patient's allergies indicates no known allergies.  Home Medications   Prior to Admission medications   Medication Sig Start Date End Date Taking? Authorizing Provider  hydrochlorothiazide (HYDRODIURIL) 25 MG tablet Take 1 tablet (25 mg total) by mouth daily. Patient is taking two tablets daily. 11/12/13  Yes Peggy Constant, MD  clindamycin-benzoyl peroxide (BENZACLIN) gel Apply 1 application topically 2 (two) times daily.     Historical Provider, MD  docusate sodium 100 MG CAPS Take 100 mg by mouth once. 10/25/13   Aviva SignsMarie L Williams, CNM  enalapril (VASOTEC) 10 MG tablet Take 1 tablet (10 mg total) by mouth daily. Patient is taking two tablets daily 11/12/13   Catalina AntiguaPeggy Constant, MD  ibuprofen (ADVIL,MOTRIN) 600 MG tablet Take 1 tablet (600 mg total) by mouth every 6 (six) hours. 10/20/13   Willodean Rosenthalarolyn Harraway-Smith, MD  labetalol (NORMODYNE) 200 MG tablet Take 4 tablets (800 mg total) by mouth 3 (three) times daily. 10/20/13   Willodean Rosenthalarolyn Harraway-Smith, MD  Prenatal Vit-Fe Fumarate-FA (PRENATAL MULTIVITAMIN) TABS tablet Take 1 tablet by mouth daily at 12 noon.    Historical Provider, MD  valACYclovir (VALTREX) 500 MG tablet Take 500 mg by mouth daily.     Historical Provider, MD   Meds Ordered  and Administered this Visit  Medications - No data to display  BP 144/105 mmHg  Pulse 81  Temp(Src) 98.5 F (36.9 C) (Oral)  Resp 18  SpO2 100%  LMP 08/03/2015 (Exact Date) No data found.   Physical Exam  Constitutional: She is oriented to person, place, and time. She appears well-developed. No distress.  Cardiovascular: Normal rate, regular rhythm and normal heart sounds.   No murmur heard. Pulmonary/Chest: Effort normal and breath sounds normal. No respiratory distress. She has no wheezes.  Lymphadenopathy:       Head (right side): No submental, no submandibular and no tonsillar  adenopathy present.       Head (left side): No submental, no submandibular and no tonsillar adenopathy present.    She has no cervical adenopathy.  Neurological: She is alert and oriented to person, place, and time.  Skin:     Nursing note and vitals reviewed.   ED Course  Procedures (including critical care time)  Labs Review Labs Reviewed - No data to display  Imaging Review No results found.   Visual Acuity Review  Right Eye Distance:   Left Eye Distance:   Bilateral Distance:    Right Eye Near:   Left Eye Near:    Bilateral Near:         MDM  No diagnosis found. Hidradenitis axillaris  Boil, breast Submandibular LN HTN  Hidradenitis Hurley stage I ( Mild disease) on her right armpit. Ideally should be fine with topical antibiotic but oral given to cover breast carbuncle as well. I gave doxycycline 100 mg BID for her breast infection. I also worry about malignancy since this has been on going for more than 6 months although unlikely in her age group. Patient advised to see PCP soon for breast U/S. Return precaution discussed. F/U as needed.  No Lymph node palpated during this exam. Likely due to previous infection. Patient advised to monitor for now.  BP slightly elevated during this visit. She is advised to see her PCP soon for reassessment and medication adjustment. She agreed with plan.     Doreene Eland, MD 08/21/15 [phone]

## 2015-12-15 ENCOUNTER — Emergency Department (HOSPITAL_COMMUNITY)
Admission: EM | Admit: 2015-12-15 | Discharge: 2015-12-15 | Disposition: A | Discharge status: Home / Self Care | Payer: Self-pay

## 2016-03-31 ENCOUNTER — Emergency Department (HOSPITAL_COMMUNITY)
Admission: EM | Admit: 2016-03-31 | Discharge: 2016-03-31 | Disposition: A | Discharge status: Home / Self Care | Payer: Medicaid Other | Attending: Emergency Medicine | Admitting: Emergency Medicine

## 2016-03-31 ENCOUNTER — Encounter (HOSPITAL_COMMUNITY): Payer: Self-pay | Admitting: Emergency Medicine

## 2016-03-31 DIAGNOSIS — I10 Essential (primary) hypertension: Secondary | ICD-10-CM | POA: Diagnosis not present

## 2016-03-31 DIAGNOSIS — Z5321 Procedure and treatment not carried out due to patient leaving prior to being seen by health care provider: Secondary | ICD-10-CM | POA: Diagnosis not present

## 2016-03-31 NOTE — ED Notes (Signed)
Called for room for placement with no response.

## 2016-03-31 NOTE — ED Notes (Signed)
No answer from pt in lobby 

## 2016-03-31 NOTE — ED Triage Notes (Signed)
Pt. Stated, Ana May been without my BP medicine and I need it. I don't have insurance so I can't go to the doctor.

## 2016-03-31 NOTE — ED Notes (Signed)
Called for room with no response 

## 2016-04-05 IMAGING — US US FETAL BPP W/O NONSTRESS
1 series · 14 of 16 positions shown · non-contrast
Comparison: none

[Series 1: us fetal bpp w/o nonstress · non-contrast · 16 acquisitions, 14 frames shown]
[im 1/16]
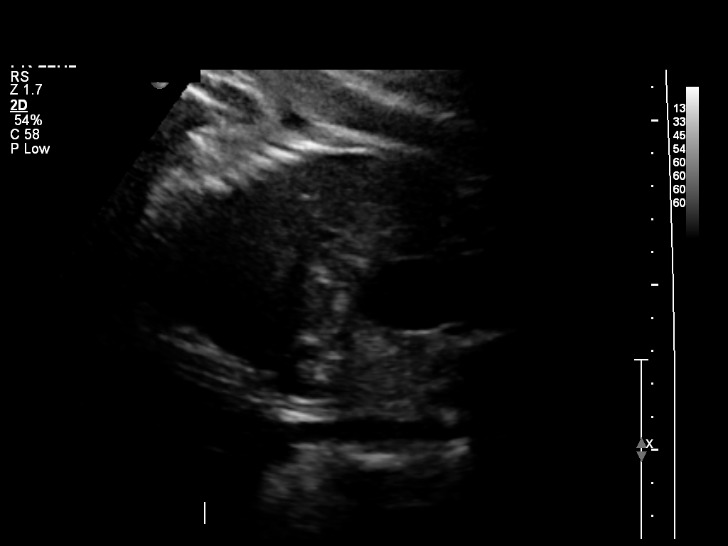
[im 2/16]
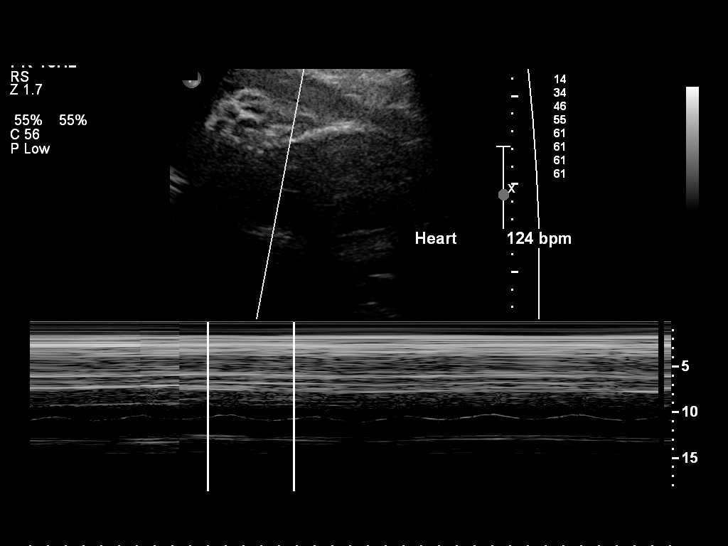
[im 3/16]
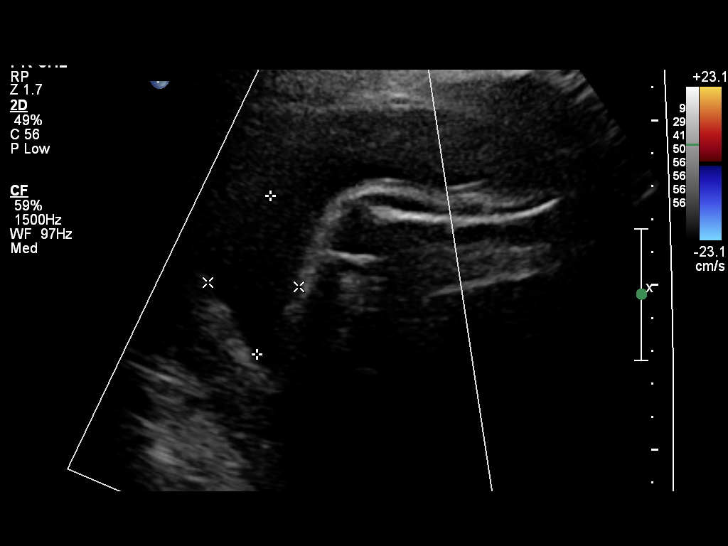
[im 5/16]
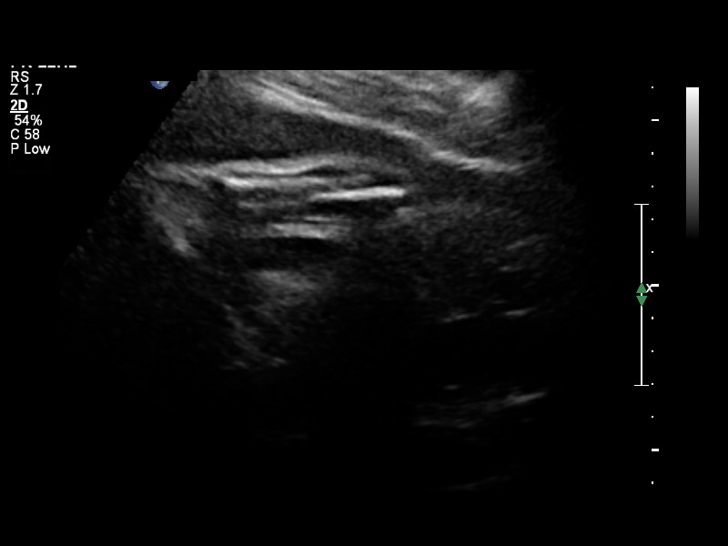
[im 6/16]
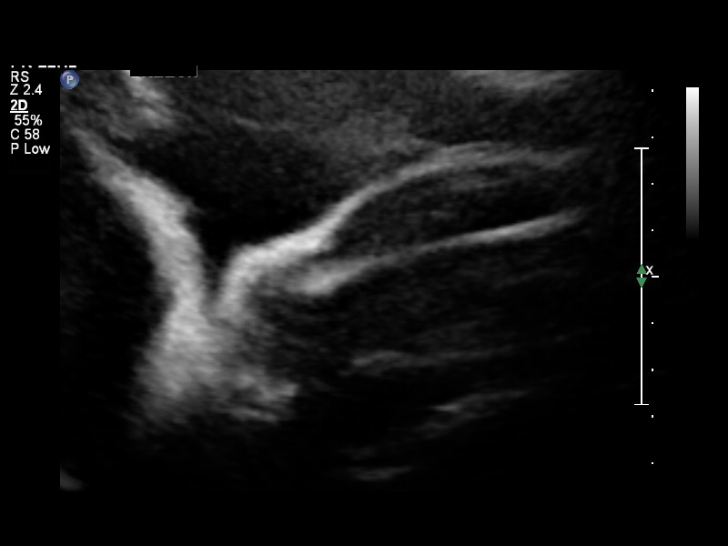
[im 7/16]
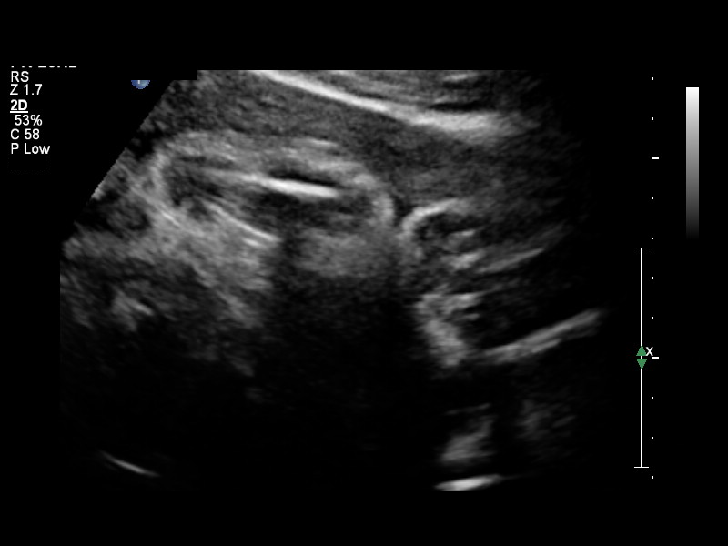
[im 8/16]
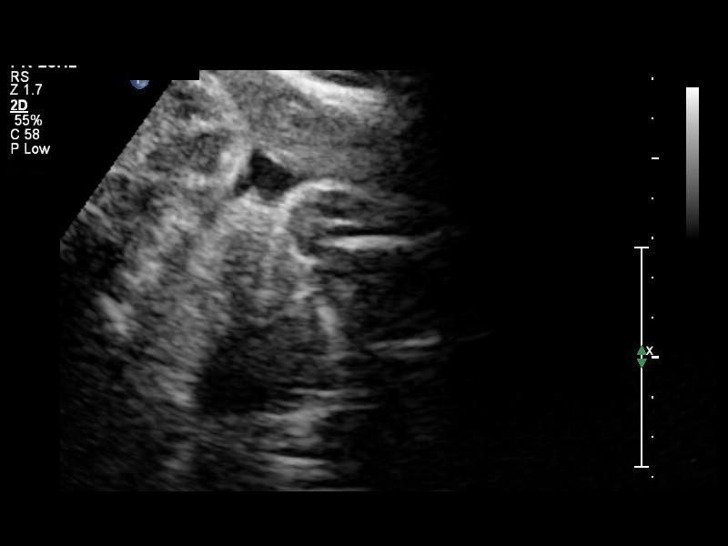
[im 9/16]
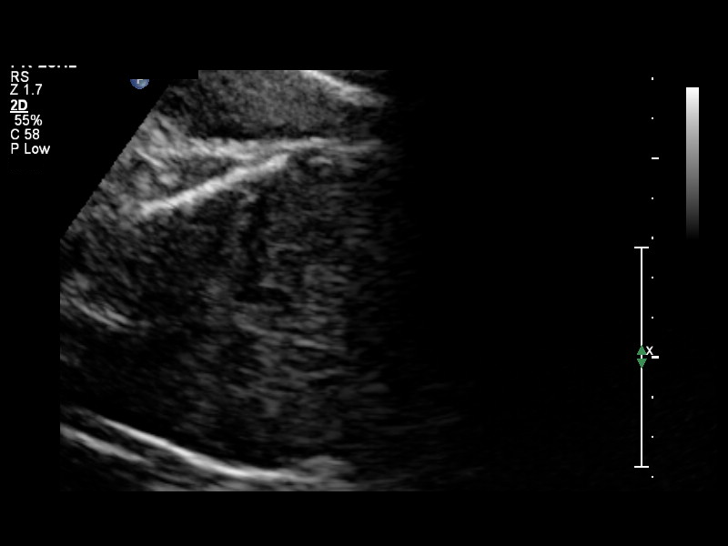
[im 10/16]
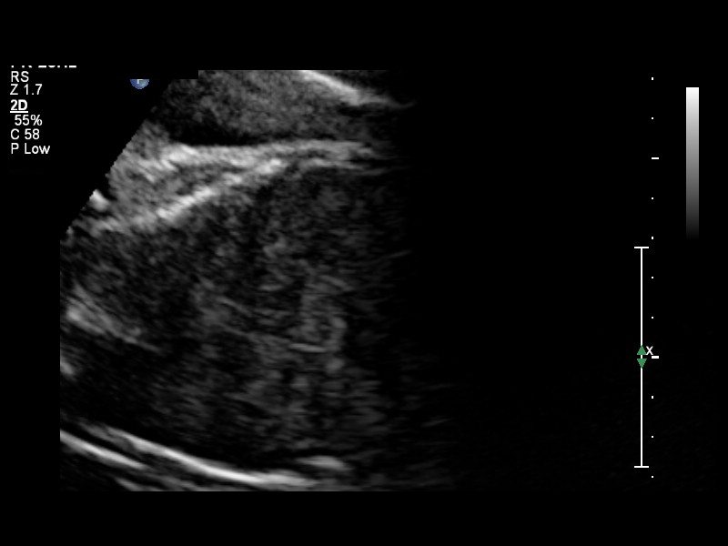
[im 11/16]
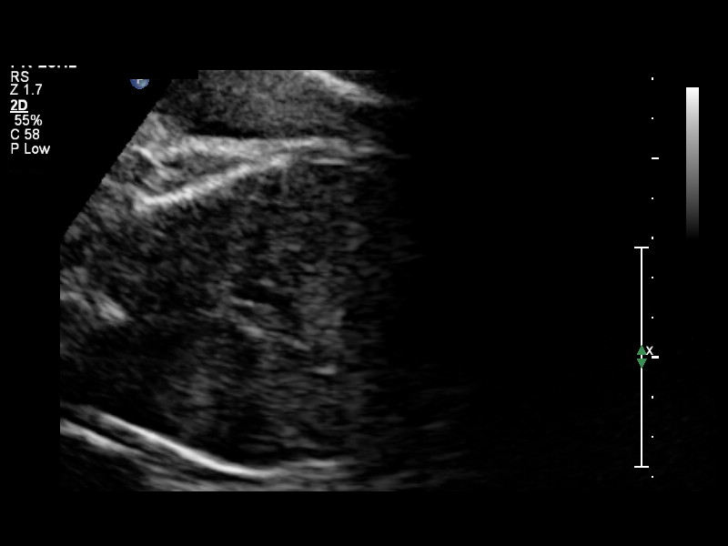
[im 13/16]
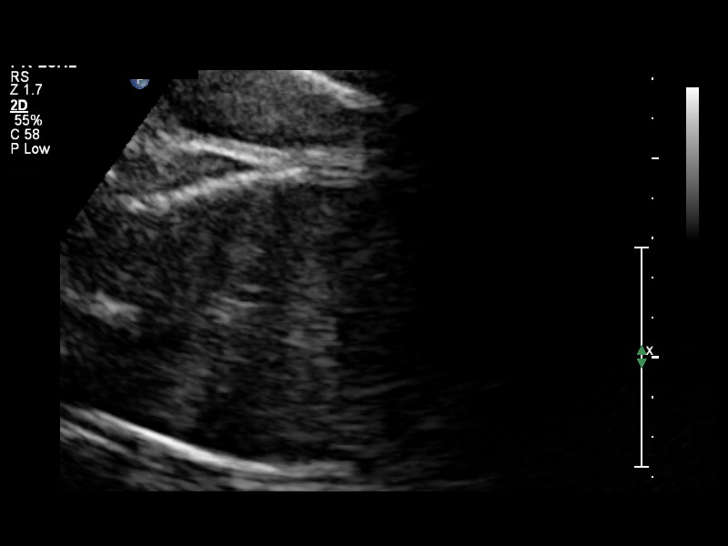
[im 14/16]
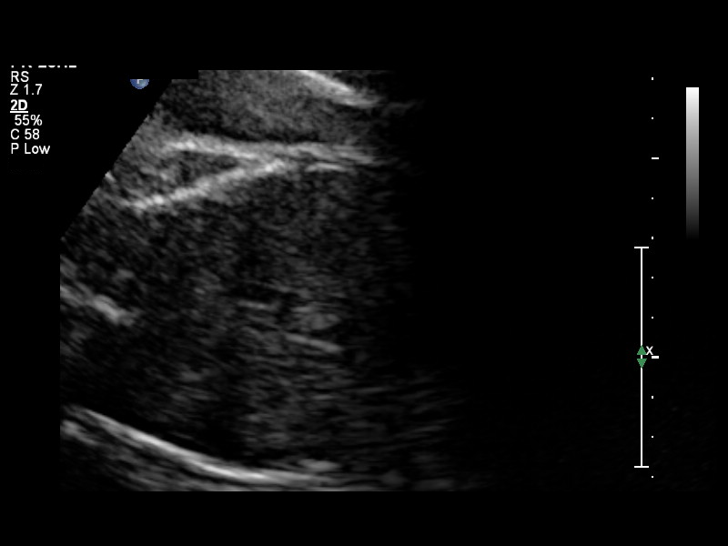
[im 15/16]
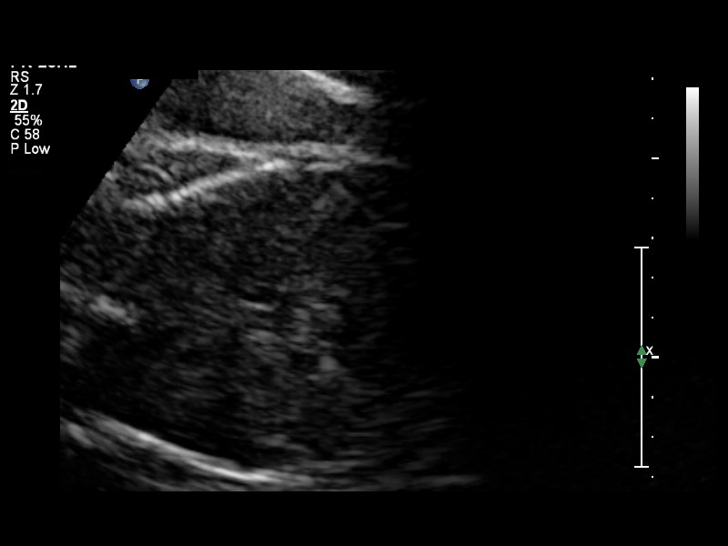
[im 16/16]
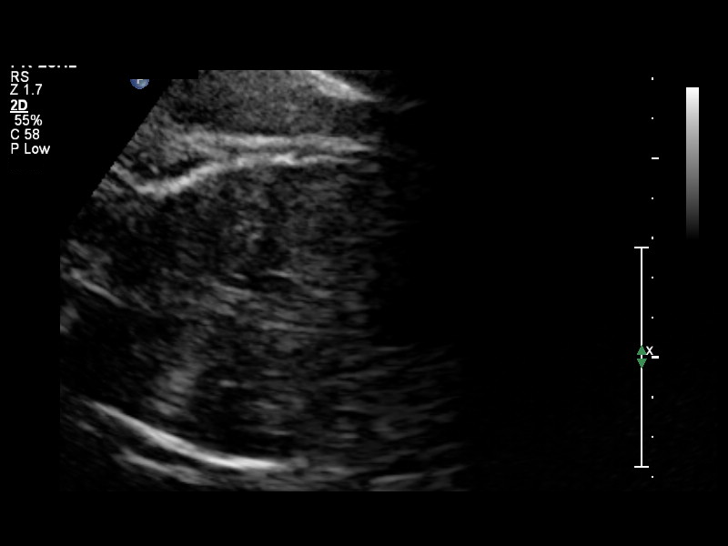

[14 of 16 positions shown; findings below may reference images not displayed]

OBSTETRICS REPORT
                      (Signed Final 10/17/2013 [DATE])

                                                         Antenatal [REDACTED]9
Service(s) Provided

Indications

 Non-reactive NST
 Hypertension - Gestational
Fetal Evaluation

 Num Of Fetuses:    1
 Fetal Heart Rate:  124                          bpm
 Cardiac Activity:  Observed
 Presentation:      Breech

 Comment:    [DATE] BPP in 14 mins. Technically difficult due to maternal
             body habitus.

 Amniotic Fluid
 AFI FV:      Subjectively within normal limits
                                              Larg Pckt:    4.8  cm
Biophysical Evaluation

 Amniotic F.V:   Pocket => 2 cm two         F. Tone:         Observed
                 planes
 F. Movement:    Observed                   Score:           [DATE]
 F. Breathing:   Observed
Gestational Age

 Best:          30w 5d     Det. By:  Early Ultrasound         EDD:   12/20/13
Impression

 Single IUP at 30w 5d
 Gestational hypertension
 BPP [DATE]
 Normal amniotic fluid volume
Recommendations

 Follow up ultrasounds as clinically indicated

## 2016-04-07 ENCOUNTER — Encounter (HOSPITAL_COMMUNITY): Payer: Self-pay | Admitting: Emergency Medicine

## 2016-04-07 ENCOUNTER — Emergency Department (HOSPITAL_COMMUNITY)
Admission: EM | Admit: 2016-04-07 | Discharge: 2016-04-07 | Disposition: A | Discharge status: Home / Self Care | Payer: Medicaid Other | Attending: Emergency Medicine | Admitting: Emergency Medicine

## 2016-04-07 ENCOUNTER — Emergency Department (HOSPITAL_COMMUNITY): Payer: Medicaid Other

## 2016-04-07 DIAGNOSIS — R42 Dizziness and giddiness: Secondary | ICD-10-CM | POA: Insufficient documentation

## 2016-04-07 DIAGNOSIS — F17200 Nicotine dependence, unspecified, uncomplicated: Secondary | ICD-10-CM | POA: Insufficient documentation

## 2016-04-07 DIAGNOSIS — Z5321 Procedure and treatment not carried out due to patient leaving prior to being seen by health care provider: Secondary | ICD-10-CM | POA: Diagnosis not present

## 2016-04-07 DIAGNOSIS — Z79899 Other long term (current) drug therapy: Secondary | ICD-10-CM | POA: Insufficient documentation

## 2016-04-07 DIAGNOSIS — R079 Chest pain, unspecified: Secondary | ICD-10-CM | POA: Insufficient documentation

## 2016-04-07 LAB — CBC
HCT: 31.9 % — ABNORMAL LOW (ref 36.0–46.0)
Hemoglobin: 9.8 g/dL — ABNORMAL LOW (ref 12.0–15.0)
MCH: 20.6 pg — ABNORMAL LOW (ref 26.0–34.0)
MCHC: 30.7 g/dL (ref 30.0–36.0)
MCV: 67.2 fL — AB (ref 78.0–100.0)
PLATELETS: 382 10*3/uL (ref 150–400)
RBC: 4.75 MIL/uL (ref 3.87–5.11)
RDW: 16 % — AB (ref 11.5–15.5)
WBC: 8.3 10*3/uL (ref 4.0–10.5)

## 2016-04-07 LAB — BASIC METABOLIC PANEL
Anion gap: 7 (ref 5–15)
BUN: 9 mg/dL (ref 6–20)
CALCIUM: 8.8 mg/dL — AB (ref 8.9–10.3)
CHLORIDE: 107 mmol/L (ref 101–111)
CO2: 23 mmol/L (ref 22–32)
CREATININE: 0.68 mg/dL (ref 0.44–1.00)
GFR calc Af Amer: >60 mL/min (ref >60)
Glucose, Bld: 91 mg/dL (ref 65–99)
Potassium: 4.1 mmol/L (ref 3.5–5.1)
SODIUM: 137 mmol/L (ref 135–145)

## 2016-04-07 LAB — I-STAT TROPONIN, ED: Troponin i, poc: 0 ng/mL (ref 0.00–0.08)

## 2016-04-07 NOTE — ED Triage Notes (Signed)
Pt. Stated, Lavenia Atlasve been off my BP medicine for one month and now Im having dizziness with chest pain off and on since I've been off my medication.

## 2016-04-07 NOTE — ED Notes (Signed)
No answer to VS x 2

## 2016-04-09 ENCOUNTER — Encounter (HOSPITAL_COMMUNITY): Payer: Self-pay | Admitting: Family Medicine

## 2016-04-09 ENCOUNTER — Ambulatory Visit (HOSPITAL_COMMUNITY)
Admission: EM | Admit: 2016-04-09 | Discharge: 2016-04-09 | Disposition: A | Discharge status: Home / Self Care | Payer: Medicaid Other | Attending: Family Medicine | Admitting: Family Medicine

## 2016-04-09 DIAGNOSIS — I10 Essential (primary) hypertension: Secondary | ICD-10-CM

## 2016-04-09 DIAGNOSIS — Z760 Encounter for issue of repeat prescription: Secondary | ICD-10-CM

## 2016-04-09 MED ORDER — HYDROCHLOROTHIAZIDE 25 MG PO TABS: 25.0000 mg | ORAL_TABLET | Freq: Every day | ORAL | 0 refills | Status: DC

## 2016-04-09 NOTE — ED Triage Notes (Signed)
Pt here for BP meds sts she has been off x 1 month.

## 2016-04-09 NOTE — ED Provider Notes (Signed)
CSN: [id number]     Arrival date & time 04/09/16  4098 History   None    Chief Complaint  Patient presents with  . Hypertension   (Consider location/radiation/quality/duration/timing/severity/associated sxs/prior Treatment) 36 year old female presents to clinic for medication refill.  She states she has been out of her lisinopril and HCTZ for 1 month as her insurance lapsed. She states she has reestablished her insurance and plans to follow up with a new PCP as she has also recently moved. She has no complaint, no headaches, dizziness, palpitations, swelling in hands, feet, or ankles, do difficulty with urination, no flank pain, no abdominal pain or shortness of breath.   The history is provided by the patient.  Hypertension     Past Medical History:  Diagnosis Date  . Hypertension    Past Surgical History:  Procedure Laterality Date  . CESAREAN SECTION    . CESAREAN SECTION N/A 10/17/2013   Procedure: CESAREAN SECTION, BILATERAL TUBAL LIGATION;  Surgeon: Tilda Burrow, MD;  Location: [facility];  Service: Obstetrics;  Laterality: N/A;  . DILATION AND CURETTAGE OF UTERUS     History reviewed. No pertinent family history. Social History  Substance Use Topics  . Smoking status: Current Some Day Smoker    Last attempt to quit: 02/16/2012  . Smokeless tobacco: Never Used  . Alcohol use Yes     Comment: none with pregnancy,but occasionally before pregnancy   OB History    Gravida Para Term Preterm AB Living   3 2   2 1 2    SAB TAB Ectopic Multiple Live Births   1       2     Review of Systems  Reason unable to perform ROS: as covered in HPI.  All other systems reviewed and are negative.   Allergies  Patient has no known allergies.  Home Medications   Prior to Admission medications   Medication Sig Start Date End Date Taking? Authorizing Provider  clindamycin-benzoyl peroxide (BENZACLIN) gel Apply 1 application topically 2 (two) times daily.     Historical Provider, MD   docusate sodium 100 MG CAPS Take 100 mg by mouth once. 10/25/13   Aviva Signs, CNM  doxycycline (VIBRAMYCIN) 100 MG capsule Take 1 capsule (100 mg total) by mouth 2 (two) times daily. 08/21/15   Doreene Eland, MD  enalapril (VASOTEC) 10 MG tablet Take 1 tablet (10 mg total) by mouth daily. Patient is taking two tablets daily 11/12/13   Catalina Antigua, MD  hydrochlorothiazide (HYDRODIURIL) 25 MG tablet Take 1 tablet (25 mg total) by mouth daily. Patient is taking two tablets daily. 11/12/13   Peggy Constant, MD  hydrochlorothiazide (HYDRODIURIL) 25 MG tablet Take 1 tablet (25 mg total) by mouth daily. 04/09/16   Dorena Bodo, NP  ibuprofen (ADVIL,MOTRIN) 600 MG tablet Take 1 tablet (600 mg total) by mouth every 6 (six) hours. 10/20/13   Willodean Rosenthal, MD  labetalol (NORMODYNE) 200 MG tablet Take 4 tablets (800 mg total) by mouth 3 (three) times daily. 10/20/13   Willodean Rosenthal, MD  Prenatal Vit-Fe Fumarate-FA (PRENATAL MULTIVITAMIN) TABS tablet Take 1 tablet by mouth daily at 12 noon.    Historical Provider, MD  valACYclovir (VALTREX) 500 MG tablet Take 500 mg by mouth daily.     Historical Provider, MD   Meds Ordered and Administered this Visit  Medications - No data to display  BP (!) 166/116   Pulse 91   Temp 98.4 F (36.9 C)  Resp 18   LMP 03/24/2016   SpO2 98%  No data found.   Physical Exam  Constitutional: She is oriented to person, place, and time. She appears well-developed and well-nourished. No distress.  HENT:  Head: Normocephalic and atraumatic.  Right Ear: External ear normal.  Left Ear: External ear normal.  Eyes: Conjunctivae are normal.  Neck: Neck supple. No JVD present.  Cardiovascular: Normal rate and regular rhythm.   No murmur heard. Pulmonary/Chest: Effort normal and breath sounds normal. No respiratory distress.  Abdominal: Soft. Bowel sounds are normal. There is no tenderness.  Musculoskeletal: She exhibits no edema.  Neurological: She  is alert and oriented to person, place, and time.  Skin: Skin is warm and dry. Capillary refill takes less than 2 seconds.  Psychiatric: She has a normal mood and affect.  Nursing note and vitals reviewed.   Urgent Care Course     Procedures (including critical care time)  Labs Review Labs Reviewed - No data to display  Imaging Review No results found.   Visual Acuity Review  Right Eye Distance:   Left Eye Distance:   Bilateral Distance:    Right Eye Near:   Left Eye Near:    Bilateral Near:         MDM   1. Hypertension, unspecified type   2. Medication refill   I have refilled your HCTZ 25 mg, take 1 tablet every morning. I have declined to refill your lisinopril as I can not see your doctor's records of when it was prescribed and what dose you were on. I have provided 1 month's supply only with no refills. You will need to reestablish care with a primary care provider who can do a physical exam, lab work, and monitor your progress on this medication.  Should you have any acute symptoms, follow up with a primary care provider, the ER, or return to clinic.      Dorena BodoLawrence Arnetha Silverthorne, NP 04/09/16 2003

## 2016-04-09 NOTE — Discharge Instructions (Signed)
I have refilled your HCTZ 25 mg, take 1 tablet every morning. I have declined to refill your lisinopril as I can not see your doctor's records of when it was prescribed and what dose you were on. I have provided 1 month's supply only with no refills. You will need to reestablish care with a primary care provider who can do a physical exam, lab work, and monitor your progress on this medication.  Should you have any acute symptoms, follow up with a primary care provider, the ER, or return to clinic.

## 2016-04-20 ENCOUNTER — Encounter: Payer: Self-pay | Admitting: Internal Medicine

## 2016-04-20 ENCOUNTER — Ambulatory Visit (INDEPENDENT_AMBULATORY_CARE_PROVIDER_SITE_OTHER): Payer: Medicaid Other | Admitting: Internal Medicine

## 2016-04-20 VITALS — BP 150/100 | HR 101 | Temp 98.8°F | Ht 67.0 in | Wt 253.0 lb

## 2016-04-20 DIAGNOSIS — Z0000 Encounter for general adult medical examination without abnormal findings: Secondary | ICD-10-CM | POA: Diagnosis not present

## 2016-04-20 DIAGNOSIS — F411 Generalized anxiety disorder: Secondary | ICD-10-CM

## 2016-04-20 DIAGNOSIS — Z7689 Persons encountering health services in other specified circumstances: Secondary | ICD-10-CM

## 2016-04-20 DIAGNOSIS — I10 Essential (primary) hypertension: Secondary | ICD-10-CM

## 2016-04-20 MED ORDER — SERTRALINE HCL 50 MG PO TABS: 50.0000 mg | ORAL_TABLET | Freq: Every day | ORAL | 0 refills | Status: AC

## 2016-04-20 MED ORDER — LISINOPRIL-HYDROCHLOROTHIAZIDE 20-25 MG PO TABS: 1.0000 | ORAL_TABLET | Freq: Every day | ORAL | 0 refills | Status: DC

## 2016-04-20 NOTE — Patient Instructions (Signed)
It was so nice to meet you!  I have sent in HCTZ-Lisinopril into your pharmacy. Please take this once a day.  I have also started you on Zoloft to help with your anxiety and depression. I gave you a list of therapists that accept Medicaid in [place]. Please call around to try to get set up with a therapist.  Your goal until our next visit: Exercise for 30 minutes per day 5 days per week.  We will see you back in 4-6 weeks.  -Dr. Nancy MarusMayo

## 2016-04-20 NOTE — Progress Notes (Signed)
   [facility] Phone: [phone]  Subjective:  Ana MallickShenella is a 36 year old female presenting to clinic for a new patient appointment. She would like to discuss HTN and anxiety/depression today.  HTN: Used to take HCTZ-Lisinopril, but was not seen by a physician for a while because her Medicaid lapsed. Was seen at urgent care recently and was noted to have high BP. She was started on HCTZ 25mg  and was told that she need to find a PCP. No issues with the HCTZ. She states it makes her pee a lot. No chest pain, no lower extremity edema.  Anxiety: Has been going on for many months. [facility] been [facility] past, but was only seen once by a therapist and then stopped going. States her anxiety is out of control and she would like to be referred to a therapist. Her main symptoms are feelings of anxiety, difficult time relaxing, and feeling irritable. No SI, no HI. No recent life stressors.  ROS: See HPI for pertinent positives and negatives  Past Medical History- HTN, anxiety  Past Surgical History- C/S in 2003 and 2015  Family history- mother with HTN, father with HTN  Social history- patient is a current smoker. She smokes 2 cigarettes per day. No drug use. No alcohol use.  Objective: BP (!) 150/100 (BP Location: Left Arm, Patient Position: Sitting, Cuff Size: Large)   Pulse (!) 101   Temp 98.8 F (37.1 C) (Oral)   Ht 5\' 7"  (1.702 m)   Wt 253 lb (114.8 kg)   LMP 03/24/2016 (Exact Date)   SpO2 98%   BMI 39.63 kg/m  Gen: Cooperative, alert, becomes tearful throughout exam HEENT: NCAT, EOMI, MMM Neck: FROM, supple CV: RRR, no murmur Resp: CTABL, no wheezes, normal work of breathing Msk: No edema, warm, normal tone, moves UE/LE spontaneously Neuro: Alert and oriented, no gross deficits Skin: No rashes, no lesions Psych: Intermittently tearful, normal rate of speech, normal judgment, normal thought content, normal behavior  GAD-7:  17  Assessment/Plan: HTN: Uncontrolled. Goal <140/90. BP 150/100 today. Recently started on HCTZ 25mg  daily. Recent Cr from 1/20 was normal. - Start Lisinopril-HCTZ 20-25mg  daily - Follow-up in 4-6 weeks for BP recheck - Will repeat BMP at that time to monitor kidney function  Anxiety: Uncontrolled. GAD-7 score is a 17 today. Was [facility] by therapist once, but never went back. - Start Zoloft 50mg  daily - Pt given list of therapists who accept Medicaid in [place] and encouraged to start calling around to get established - Follow-up in 4-6 weeks to monitor symptoms. Repeat GAD-7 at that time. - Encouraged Pt to exercise for 30 minutes 5 days a week to help with mood symptoms and weight.  Health Care Maintenance: - Follow-up in 4-6 weeks for pap   Willadean CarolKaty Thyra Yinger, MD PGY-2

## 2016-04-21 ENCOUNTER — Encounter: Payer: Self-pay | Admitting: Internal Medicine

## 2016-04-21 DIAGNOSIS — F411 Generalized anxiety disorder: Secondary | ICD-10-CM | POA: Insufficient documentation

## 2016-04-21 DIAGNOSIS — F17200 Nicotine dependence, unspecified, uncomplicated: Secondary | ICD-10-CM | POA: Insufficient documentation

## 2016-04-21 DIAGNOSIS — I10 Essential (primary) hypertension: Secondary | ICD-10-CM | POA: Insufficient documentation

## 2016-04-21 DIAGNOSIS — Z0000 Encounter for general adult medical examination without abnormal findings: Secondary | ICD-10-CM | POA: Insufficient documentation

## 2016-04-21 NOTE — Assessment & Plan Note (Signed)
Uncontrolled. Goal <140/90. BP 150/100 today. Recently started on HCTZ 25mg  daily. Recent Cr from 1/20 was normal. - Start Lisinopril-HCTZ 20-25mg  daily - Follow-up in 4-6 weeks for BP recheck - Will repeat BMP at that time to monitor kidney function

## 2016-04-21 NOTE — Assessment & Plan Note (Signed)
-   Follow-up in 4-6 weeks for pap

## 2016-04-21 NOTE — Assessment & Plan Note (Signed)
Uncontrolled. GAD-7 score is a 17 today. Was seen [facility] therapist once, but never went back. - Start Zoloft 50mg  daily - Pt given list of therapists who accept Medicaid in [place] and encouraged to start calling around to get established - Follow-up in 4-6 weeks to monitor symptoms. Repeat GAD-7 at that time. - Encouraged Pt to exercise for 30 minutes 5 days a week to help with mood symptoms and weight.

## 2016-04-30 ENCOUNTER — Ambulatory Visit: Payer: Medicaid Other | Admitting: Internal Medicine

## 2016-05-21 ENCOUNTER — Ambulatory Visit: Payer: Self-pay | Admitting: Internal Medicine

## 2016-06-08 ENCOUNTER — Ambulatory Visit: Payer: Self-pay | Admitting: Family Medicine

## 2017-05-04 ENCOUNTER — Ambulatory Visit (HOSPITAL_COMMUNITY)
Admission: EM | Admit: 2017-05-04 | Discharge: 2017-05-04 | Disposition: A | Discharge status: Home / Self Care | Payer: Self-pay | Attending: Family Medicine | Admitting: Family Medicine

## 2017-05-04 ENCOUNTER — Encounter (HOSPITAL_COMMUNITY): Payer: Self-pay | Admitting: Emergency Medicine

## 2017-05-04 DIAGNOSIS — I10 Essential (primary) hypertension: Secondary | ICD-10-CM

## 2017-05-04 MED ORDER — LISINOPRIL-HYDROCHLOROTHIAZIDE 20-25 MG PO TABS: 1.0000 | ORAL_TABLET | Freq: Every day | ORAL | 1 refills | Status: AC

## 2017-05-04 NOTE — ED Provider Notes (Signed)
[facility]   [id number] 05/04/17 Arrival Time: 1349  ASSESSMENT & PLAN:  1. Essential hypertension     Meds ordered this encounter  Medications  . lisinopril-hydrochlorothiazide (PRINZIDE,ZESTORETIC) 20-25 MG tablet    Sig: Take 1 tablet by mouth daily.    Dispense:  90 tablet    Refill:  1    Order Specific Question:   Supervising Provider    Answer:   Mardella LaymanHAGLER, BRIAN [[id number]]    Reviewed expectations re: course of current medical issues. Questions answered. Outlined signs and symptoms indicating need for more acute intervention. Patient verbalized understanding. After Visit Summary given.   SUBJECTIVE: History from: patient. Ana May is a 37 y.o. female who presents with complaint of intermittent elevated bp needs refills. Reports abrupt onset today. Described symptoms have been intermittent since beginning.  ROS: As per HPI.   OBJECTIVE:  Vitals:   05/04/17 1431  BP: (!) 185/125  Pulse: 84  Resp: 18  Temp: (!) 97.5 F (36.4 C)  TempSrc: Oral  SpO2: 100%    General appearance: alert; no distress Eyes: PERRLA; EOMI; conjunctiva normal HENT: normocephalic; atraumatic; TMs normal; nasal mucosa normal; oral mucosa normal Neck: supple  Lungs: clear to auscultation bilaterally Heart: regular rate and rhythm Abdomen: soft, non-tender; bowel sounds normal; no masses or organomegaly; no guarding or rebound tenderness Back: no CVA tenderness Extremities: no cyanosis or edema; symmetrical with no gross deformities Skin: warm and dry Neurologic: normal gait; normal symmetric reflexes Psychological: alert and cooperative; normal mood and affect  Labs: Results for orders placed or performed during the hospital encounter of 04/07/16  Basic metabolic panel  Result Value Ref Range   Sodium 137 135 - 145 mmol/L   Potassium 4.1 3.5 - 5.1 mmol/L   Chloride 107 101 - 111 mmol/L   CO2 23 22 - 32 mmol/L   Glucose, Bld 91 65 - 99 mg/dL   BUN 9 6 - 20  mg/dL   Creatinine, Ser 4.400.68 0.44 - 1.00 mg/dL   Calcium 8.8 (L) 8.9 - 10.3 mg/dL   GFR calc non Af Amer >60 >60 mL/min   GFR calc Af Amer >60 >60 mL/min   Anion gap 7 5 - 15  CBC  Result Value Ref Range   WBC 8.3 4.0 - 10.5 K/uL   RBC 4.75 3.87 - 5.11 MIL/uL   Hemoglobin 9.8 (L) 12.0 - 15.0 g/dL   HCT 10.231.9 (L) 72.536.0 - 36.646.0 %   MCV 67.2 (L) 78.0 - 100.0 fL   MCH 20.6 (L) 26.0 - 34.0 pg   MCHC 30.7 30.0 - 36.0 g/dL   RDW 44.016.0 (H) 34.711.5 - 42.515.5 %   Platelets 382 150 - 400 K/uL  I-stat troponin, ED  Result Value Ref Range   Troponin i, poc 0.00 0.00 - 0.08 ng/mL   Comment 3           Labs Reviewed - No data to display  Imaging: No results found.  No Known Allergies  Past Medical History:  Diagnosis Date  . Hypertension    Social History   Socioeconomic History  . Marital status: Single    Spouse name: Not on file  . Number of children: Not on file  . Years of education: Not on file  . Highest education level: Not on file  Social Needs  . Financial resource strain: Not on file  . Food insecurity - worry: Not on file  . Food insecurity - inability: Not on file  .  Transportation needs - medical: Not on file  . Transportation needs - non-medical: Not on file  Occupational History  . Not on file  Tobacco Use  . Smoking status: Current Some Day Smoker    Types: Cigarettes    Last attempt to quit: 02/16/2012    Years since quitting: 5.2  . Smokeless tobacco: Never Used  Substance and Sexual Activity  . Alcohol use: No    Comment: none with pregnancy,but occasionally before pregnancy  . Drug use: No  . Sexual activity: Not Currently  Other Topics Concern  . Not on file  Social History Narrative  . Not on file   Family History  Problem Relation Age of Onset  . Hypertension Mother   . Hypertension Father    Past Surgical History:  Procedure Laterality Date  . CESAREAN SECTION    . CESAREAN SECTION N/A 10/17/2013   Procedure: CESAREAN SECTION, BILATERAL TUBAL  LIGATION;  Surgeon: Tilda Burrow, MD;  Location: [facility];  Service: Obstetrics;  Laterality: N/A;  . DILATION AND CURETTAGE OF UTERUS       Deatra Canter, [place] 05/04/17 2045

## 2017-05-04 NOTE — ED Triage Notes (Signed)
PT C/O: reports HTN.... Reports she has been out of BP meds x6 months ++   Sx also include edema to bilateral foot and dizziness, HA  A&O x4... NAD... Ambulatory

## 2018-09-26 IMAGING — DX DG CHEST 2V
2 series · 2 of 2 positions shown · non-contrast
Comparison: 06/14/2012

CLINICAL DATA: Dizziness and chest pain.

EXAM:
CHEST  2 VIEW

[chest pa]
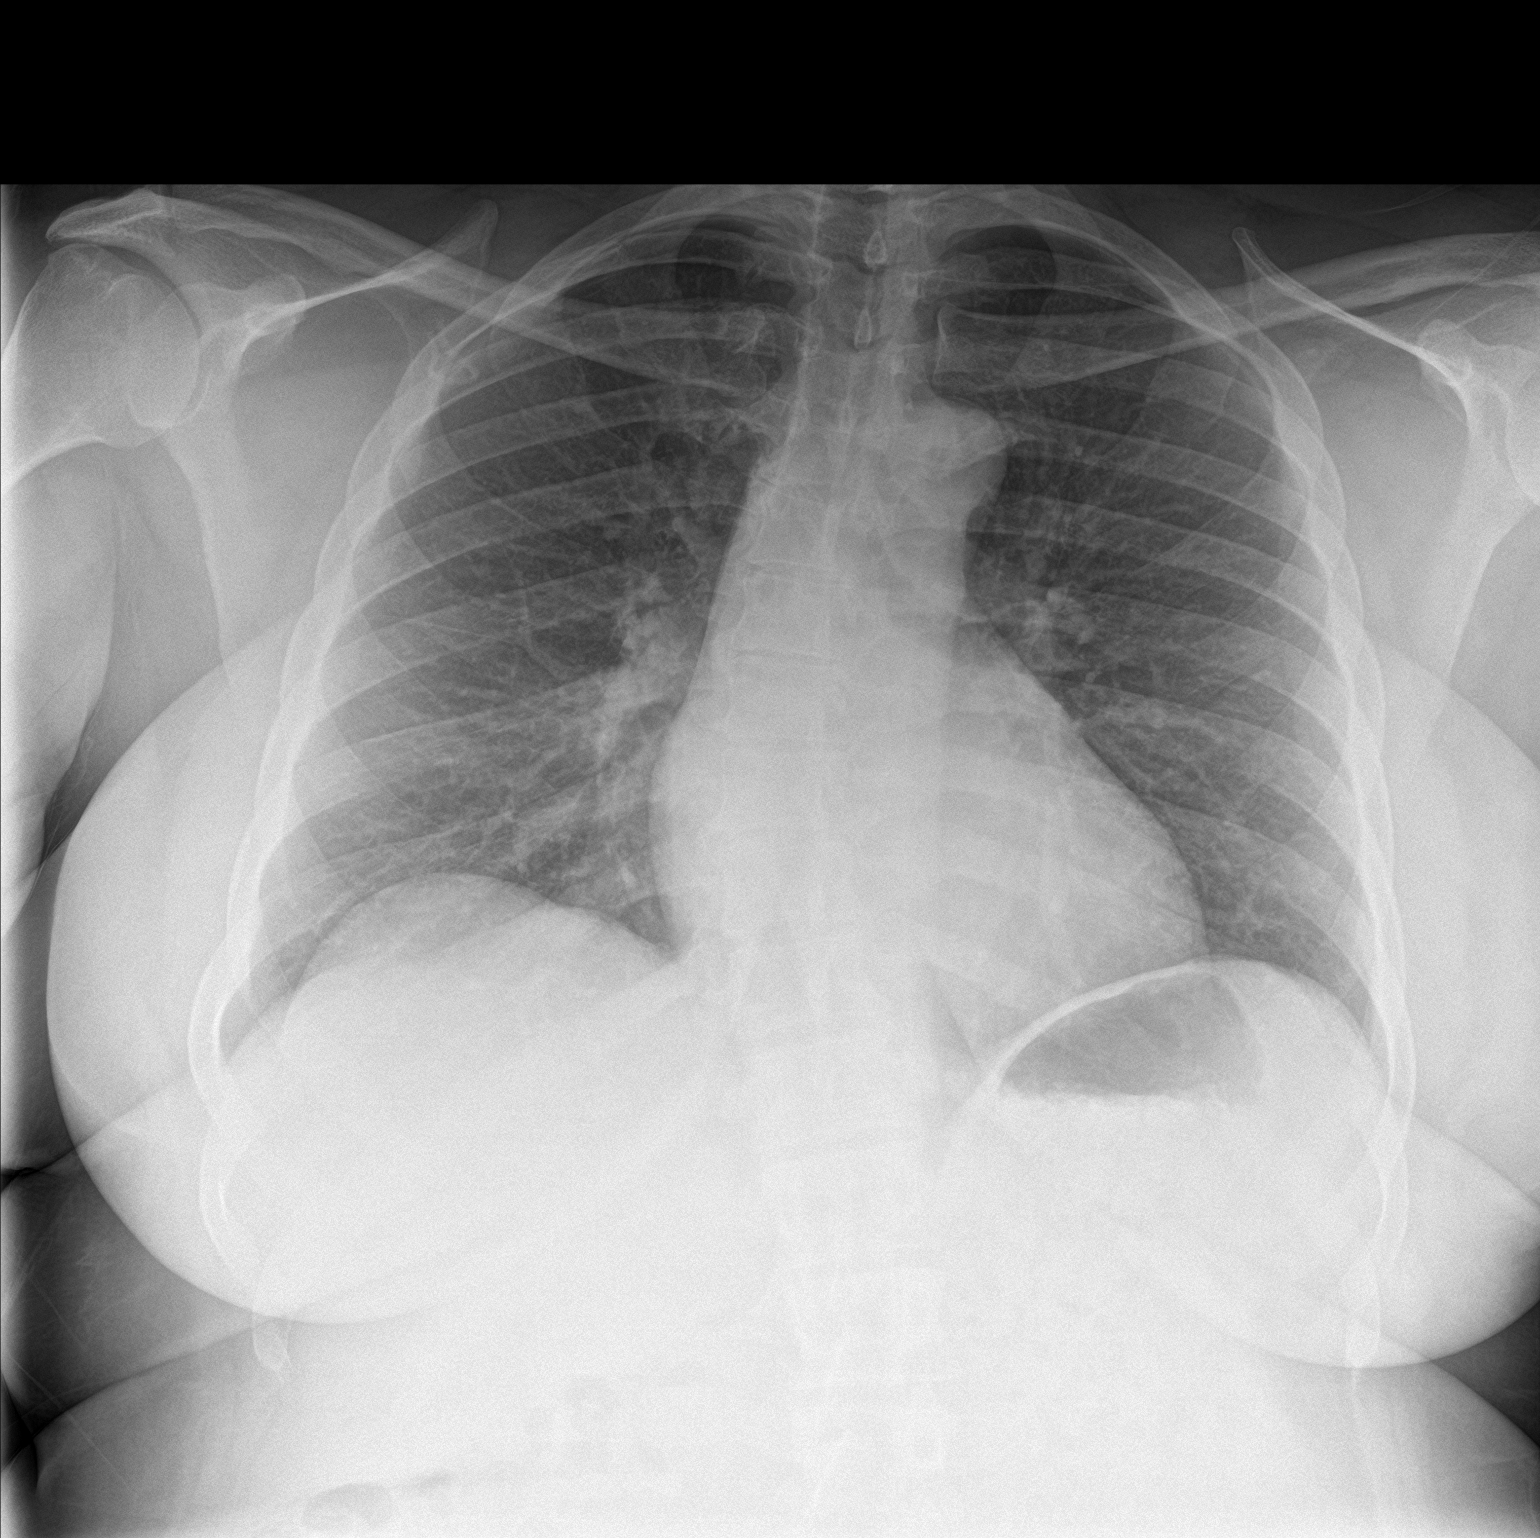

[chest lat]
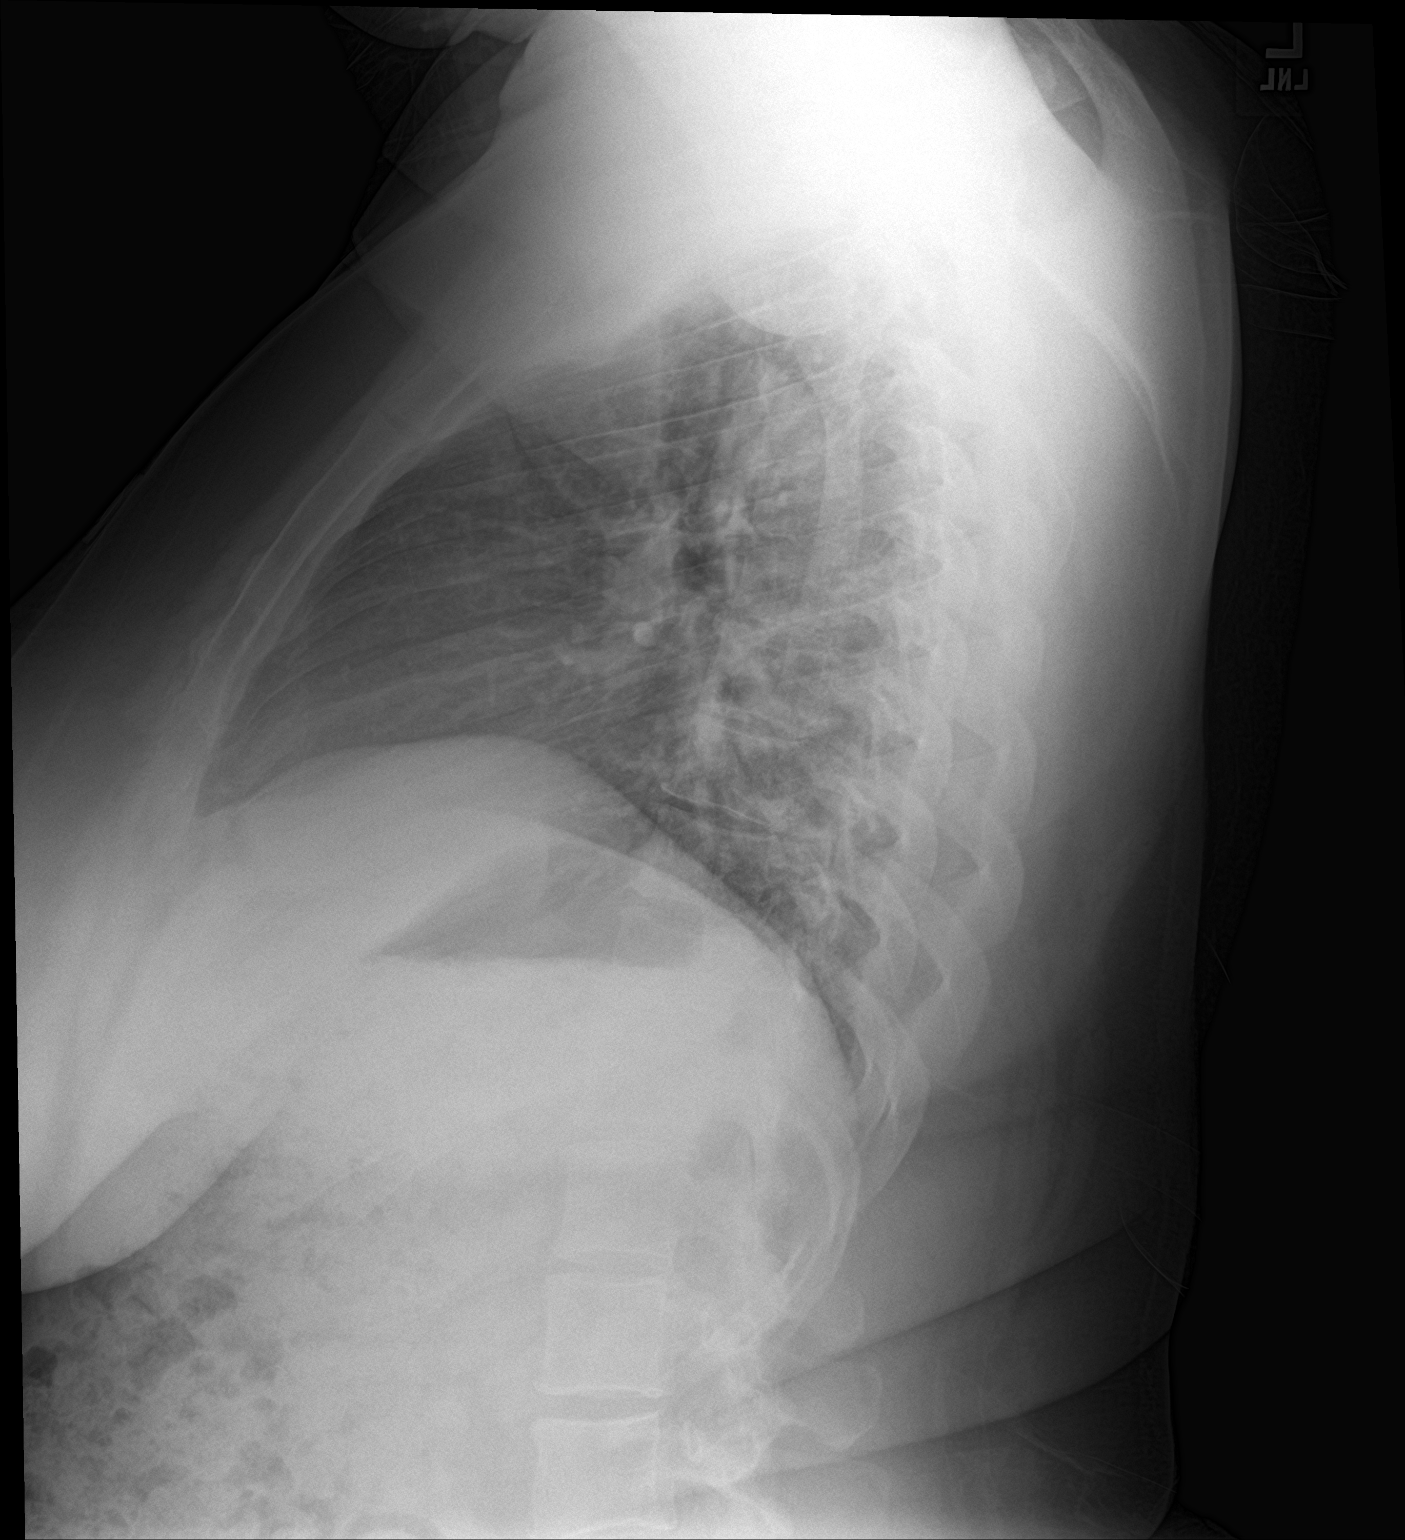

[2 of 2 positions shown; findings below may reference images not displayed]

FINDINGS: The heart size and mediastinal contours are within normal limits.
There is no evidence of pulmonary edema, consolidation,
pneumothorax, nodule or pleural fluid. The visualized skeletal
structures are unremarkable.
IMPRESSION: No active cardiopulmonary disease.
